# Patient Record
Sex: Male | Born: 1960 | Race: White | Hispanic: No | Marital: Married | Smoking: Former smoker
Health system: Southern US, Community
[De-identification: ages and names within clinical notes are randomized; demographics above are authoritative.]

## PROBLEM LIST (undated history)

## (undated) DIAGNOSIS — O2230 Deep phlebothrombosis in pregnancy, unspecified trimester: Secondary | ICD-10-CM

## (undated) DIAGNOSIS — K219 Gastro-esophageal reflux disease without esophagitis: Secondary | ICD-10-CM

## (undated) DIAGNOSIS — F419 Anxiety disorder, unspecified: Secondary | ICD-10-CM

## (undated) DIAGNOSIS — I10 Essential (primary) hypertension: Secondary | ICD-10-CM

## (undated) DIAGNOSIS — R760 Raised antibody titer: Secondary | ICD-10-CM

## (undated) HISTORY — DX: Anxiety disorder, unspecified: F41.9

## (undated) HISTORY — DX: Gastro-esophageal reflux disease without esophagitis: K21.9

## (undated) HISTORY — DX: Deep phlebothrombosis in pregnancy, unspecified trimester: O22.30

## (undated) HISTORY — DX: Raised antibody titer: R76.0

## (undated) HISTORY — PX: OTHER SURGICAL HISTORY: SHX169

## (undated) HISTORY — PX: VASECTOMY: SHX75

## (undated) HISTORY — DX: Essential (primary) hypertension: I10

---

## 2014-11-01 DIAGNOSIS — D6859 Other primary thrombophilia: Secondary | ICD-10-CM | POA: Insufficient documentation

## 2014-11-01 DIAGNOSIS — Z86718 Personal history of other venous thrombosis and embolism: Secondary | ICD-10-CM | POA: Insufficient documentation

## 2020-02-24 ENCOUNTER — Ambulatory Visit: Payer: 59 | Admitting: Cardiology

## 2020-02-24 ENCOUNTER — Encounter: Payer: Self-pay | Admitting: Cardiology

## 2020-02-24 ENCOUNTER — Other Ambulatory Visit: Payer: Self-pay

## 2020-02-24 VITALS — BP 154/92 | HR 92 | Ht 69.0 in | Wt 220.8 lb

## 2020-02-24 DIAGNOSIS — I10 Essential (primary) hypertension: Secondary | ICD-10-CM

## 2020-02-24 DIAGNOSIS — R002 Palpitations: Secondary | ICD-10-CM | POA: Diagnosis not present

## 2020-02-24 MED ORDER — AMLODIPINE BESYLATE 5 MG PO TABS: 5.0000 mg | ORAL_TABLET | Freq: Every day | ORAL | 3 refills | Status: DC

## 2020-02-24 NOTE — Progress Notes (Signed)
Referring-Derek Pollard Reason for referral-palpitations  HPI: 59 year old male for evaluation of palpitations at request of Marylene Land PA-C.  Laboratory September 2021 showed hemoglobin 18.5, platelet count 194, potassium 4.7, sodium 139, BUN 15 and creatinine 0.98.  Total cholesterol 179, HDL 71 and LDL 93.  Patient seen at [facility] February 15, 2019 for palpitations and dizziness.  Troponins were normal.  Chest x-ray negative.  Based on report electrocardiogram showed normal sinus rhythm.  Patient typically does not have dyspnea on exertion, orthopnea, PND, pedal edema, syncope or exertional chest pain.  Approximately 1 week ago he felt as though his blood pressure was high and felt "off".  He then later developed a pounding sensation in his chest lasting approximately 2 hours.  There was mild dizziness but no syncope.  No significant chest pain.  He was seen in the emergency room as outlined above.  Since that time he has had several episodes of his heart pounding for 15 minutes.  His blood pressure has spiked at times and is in the 150-170 range by his report.  Cardiology now asked to evaluate.  Note he does not have heat intolerance.  And has lost no weight recently.  Current Outpatient Medications  Medication Sig Dispense Refill   Cholecalciferol 25 MCG (1000 UT) tablet Take 1,000 Units by mouth daily.     cyanocobalamin 1000 MCG tablet Take 1,000 mcg by mouth daily.     dextromethorphan-guaiFENesin (MUCINEX DM) 30-600 MG 12hr tablet Take 1 tablet by mouth daily.     escitalopram (LEXAPRO) 20 MG tablet Take 20 mg by mouth daily.     ketoconazole (NIZORAL) 2 % shampoo Apply 1 application topically as needed. Skin irritation     lisinopril (ZESTRIL) 10 MG tablet Take 10 mg by mouth daily.     testosterone cypionate (DEPOTESTOSTERONE CYPIONATE) 200 MG/ML injection Inject 200 mg into the muscle every 14 (fourteen) days.     No current  facility-administered medications for this visit.    Allergies  Allergen Reactions   Diclofenac Sodium Nausea And Vomiting   Other    Chlorhexidine Rash     Past Medical History:  Diagnosis Date   Anxiety    DVT (deep vein thrombosis) in pregnancy    Gastroesophageal reflux disease    Hypertension    Lupus anticoagulant positive     Past Surgical History:  Procedure Laterality Date   Ankle fracture repair     VASECTOMY      Social History   Socioeconomic History   Marital status: Married    Spouse name: Not on file   Number of children: 5   Years of education: Not on file   Highest education level: Not on file  Occupational History   Not on file  Tobacco Use   Smoking status: Former Smoker  Substance and Sexual Activity   Alcohol use: Yes    Comment: 4 beers per day   Drug use: Not on file   Sexual activity: Not on file  Other Topics Concern   Not on file  Social History Narrative   Not on file   Social Determinants of Health   Financial Resource Strain:    Difficulty of Paying Living Expenses: Not on file  Food Insecurity:    Worried About Programme researcher, broadcasting/film/video in the Last Year: Not on file   [employer] of Food in the Last Year: Not on file  Transportation Needs:    Lack  of Transportation (Medical): Not on file   Lack of Transportation (Non-Medical): Not on file  Physical Activity:    Days of Exercise per Week: Not on file   Minutes of Exercise per Session: Not on file  Stress:    Feeling of Stress : Not on file  Social Connections:    Frequency of Communication with Friends and Family: Not on file   Frequency of Social Gatherings with Friends and Family: Not on file   Attends Religious Services: Not on file   Active Member of Clubs or Organizations: Not on file   Attends Banker Meetings: Not on file   Marital Status: Not on file  Intimate Partner Violence:    Fear of Current or Ex-Partner: Not on  file   Emotionally Abused: Not on file   Physically Abused: Not on file   Sexually Abused: Not on file    Family History  Problem Relation Age of Onset   Throat cancer Father     ROS: Diarrhea that he attributes to lisinopril but no fevers or chills, productive cough, hemoptysis, dysphasia, odynophagia, melena, hematochezia, dysuria, hematuria, rash, seizure activity, orthopnea, PND, pedal edema, claudication. Remaining systems are negative.  Physical Exam:   Blood pressure (!) 154/92, pulse 92, height 5\' 9"  (1.753 m), weight 220 lb 12.8 oz (100.2 kg).  General:  Well developed/well nourished anxious in NAD Skin warm/dry Patient not depressed No peripheral clubbing Back-normal HEENT-normal/normal eyelids Neck supple/normal carotid upstroke bilaterally; no bruits; no JVD; no thyromegaly chest - CTA/ normal expansion CV - RRR/normal S1 and S2; no murmurs, rubs or gallops;  PMI nondisplaced Abdomen -NT/ND, no HSM, no mass, + bowel sounds, no bruit 2+ femoral pulses, no bruits Ext-no edema, chords, 2+ DP Neuro-grossly nonfocal  ECG -normal sinus rhythm at a rate of 92, no ST changes.  Personally reviewed  A/P  1 palpitations-etiology unclear.  We will arrange an echocardiogram to assess LV function.  Check TSH.  I have discussed an apple watch or similar model to record rhythm strips.  We will treat if arrhythmia identified.  2 hypertension-patient's blood pressure has been elevated.  He feels as though lisinopril has caused diarrhea.  We will discontinue.  Instead I will treat with amlodipine 5 mg daily.  Follow blood pressure and adjust regimen as needed.  Likely has primary hypertension.  We will consider evaluation for secondary causes such as pheochromocytoma or renal artery stenosis if blood pressure difficult to control.  , MD

## 2020-02-24 NOTE — Patient Instructions (Signed)
Medication Instructions:   STOP LISINOPRIL  START AMLODIPINE 5 MG ONCE DAILY  *If you need a refill on your cardiac medications before your next appointment, please call your pharmacy*   Lab Work:  Your physician recommends that you HAVE LAB WORK TODAY  If you have labs (blood work) drawn today and your tests are completely normal, you will receive your results only by: Marland Kitchen MyChart Message (if you have MyChart) OR . A paper copy in the mail If you have any lab test that is abnormal or we need to change your treatment, we will call you to review the results.   Testing/Procedures:  Your physician has requested that you have an echocardiogram. Echocardiography is a painless test that uses sound waves to create images of your heart. It provides your doctor with information about the size and shape of your heart and how well your heart's chambers and valves are working. This procedure takes approximately one hour. There are no restrictions for this procedure.[address] [place], 1ST FLOOR IMAGING DEPARTMENT     Follow-Up: At [facility], you and your health needs are our priority.  As part of our continuing mission to provide you with exceptional heart care, we have created designated Provider Care Teams.  These Care Teams include your primary Cardiologist (physician) and Advanced Practice Providers (APPs -  Physician Assistants and Nurse Practitioners) who all work together to provide you with the care you need, when you need it.  We recommend signing up for the patient portal called "MyChart".  Sign up information is provided on this After Visit Summary.  MyChart is used to connect with patients for Virtual Visits (Telemedicine).  Patients are able to view lab/test results, encounter notes, upcoming appointments, etc.  Non-urgent messages can be sent to your provider as well.   To learn more about what you can do with MyChart, go to [email or web address].    Your next  appointment:   8 week(s)  The format for your next appointment:   In Person  Provider:   Olga Millers, MD

## 2020-02-25 ENCOUNTER — Encounter: Payer: Self-pay | Admitting: *Deleted

## 2020-02-25 LAB — TSH: TSH: 1.7 m[IU]/L (ref 0.40–4.50)

## 2020-02-29 ENCOUNTER — Telehealth: Payer: Self-pay | Admitting: Cardiology

## 2020-02-29 NOTE — Telephone Encounter (Signed)
° ° °  Pt said over the weekend he felt his heart beating hard and fast he look at his watch and he was in Afib. He is calling to get Dr. Jens Som recommendations.

## 2020-02-29 NOTE — Telephone Encounter (Signed)
Patient states that on Saturday afternoon he had a 10-minute long episode of feeling pounding and irregular fluttering in his chest that was also flagged as afib by his new apple watch. This occurred after going to football game. He unpacked his car and then sat down to watch some TV. Pt states he sat quietly and it went away on its own. Pt adds that he feels these pounding, fluttering sensations frequently, but they generally only last for about 30 seconds. This does not happen every day but can happen up to 3 times a day. Pt states he doesn't feel like activity brings it on. For example, pt did strenuous yard work at other times over the weekend and did not experience the chest sensations. Pt states the readings per his watch give him an average heart rate of 96 bpm, but does not tell him the maximum HR achieved. Pt states that his heart rate can vary from the high 50s to the high 90s throughout the day even when he is not feeling noticeable symptoms.  Pt is going to complete his MyChart registration and upload the PDF documents of the rhythm strips from his watch to his chart for review.  Pt has not been able to take his blood pressure at home during these episodes. Of note, he has not started yet started the Norvasc that was ordered for him. He has been continuing the remaining doses of his lisinopril. However, his Norvasc arrived in the mail today and will start it tomorrow.  Will seek Dr. Ludwig Clarks recommendations.

## 2020-02-29 NOTE — Telephone Encounter (Signed)
Need rhythm strips to review before deciding whether this is atrial fibrillation or not. Derek Pollard

## 2020-03-01 NOTE — Telephone Encounter (Signed)
See my chart encounter.

## 2020-03-09 DIAGNOSIS — R002 Palpitations: Secondary | ICD-10-CM

## 2020-03-09 MED ORDER — AMLODIPINE BESYLATE 10 MG PO TABS: 10.0000 mg | ORAL_TABLET | Freq: Every day | ORAL | 3 refills | Status: DC

## 2020-03-09 NOTE — Telephone Encounter (Signed)
Change amlodipine to 10 mg daily and follow blood pressure.  Olga Millers

## 2020-03-16 ENCOUNTER — Other Ambulatory Visit: Payer: Self-pay

## 2020-03-16 ENCOUNTER — Ambulatory Visit (HOSPITAL_BASED_OUTPATIENT_CLINIC_OR_DEPARTMENT_OTHER)
Admission: RE | Admit: 2020-03-16 | Discharge: 2020-03-16 | Disposition: A | Discharge status: Home / Self Care | Payer: 59 | Source: Ambulatory Visit | Attending: Cardiology | Admitting: Cardiology

## 2020-03-16 DIAGNOSIS — R002 Palpitations: Secondary | ICD-10-CM | POA: Diagnosis not present

## 2020-03-16 LAB — ECHOCARDIOGRAM COMPLETE
Area-P 1/2: 4.17 cm2
S' Lateral: 3.2 cm

## 2020-03-21 ENCOUNTER — Encounter: Payer: Self-pay | Admitting: *Deleted

## 2020-05-13 NOTE — Progress Notes (Signed)
HPI: FU palpitations.  Echocardiogram November 2021 showed normal LV function, possible small PFO.  TSH November 2021 1.7.  Patient sent strips associated with palpitations and it showed sinus with PVCs.  We also have increased his amlodipine for hypertension.  Since last seen there is no dyspnea, chest pain or syncope.  Occasional flutters when he is stressed.  Current Outpatient Medications  Medication Sig Dispense Refill  . amLODipine (NORVASC) 10 MG tablet Take 1 tablet (10 mg total) by mouth daily. 90 tablet 3  . Cholecalciferol 25 MCG (1000 UT) tablet Take 1,000 Units by mouth daily.    . cyanocobalamin 1000 MCG tablet Take 1,000 mcg by mouth daily.    Marland Kitchen dextromethorphan-guaiFENesin (MUCINEX DM) 30-600 MG 12hr tablet Take 1 tablet by mouth daily.    Marland Kitchen escitalopram (LEXAPRO) 20 MG tablet Take 20 mg by mouth daily.    Marland Kitchen ketoconazole (NIZORAL) 2 % shampoo Apply 1 application topically as needed. Skin irritation    . testosterone cypionate (DEPOTESTOSTERONE CYPIONATE) 200 MG/ML injection Inject 200 mg into the muscle every 14 (fourteen) days.    . predniSONE (DELTASONE) 10 MG tablet Take as directed on HANDOUT.     No current facility-administered medications for this visit.     Past Medical History:  Diagnosis Date  . Anxiety   . DVT (deep vein thrombosis) in pregnancy   . Gastroesophageal reflux disease   . Hypertension   . Lupus anticoagulant positive     Past Surgical History:  Procedure Laterality Date  . Ankle fracture repair    . VASECTOMY      Social History   Socioeconomic History  . Marital status: Married    Spouse name: Not on file  . Number of children: 5  . Years of education: Not on file  . Highest education level: Not on file  Occupational History  . Not on file  Tobacco Use  . Smoking status: Former Games developer  . Smokeless tobacco: Never Used  Substance and Sexual Activity  . Alcohol use: Yes    Comment: 4 beers per day  . Drug use: Not on file   . Sexual activity: Not on file  Other Topics Concern  . Not on file  Social History Narrative  . Not on file   Social Determinants of Health   Financial Resource Strain: Not on file  Food Insecurity: Not on file  Transportation Needs: Not on file  Physical Activity: Not on file  Stress: Not on file  Social Connections: Not on file  Intimate Partner Violence: Not on file    Family History  Problem Relation Age of Onset  . Throat cancer Father     ROS: no fevers or chills, productive cough, hemoptysis, dysphasia, odynophagia, melena, hematochezia, dysuria, hematuria, rash, seizure activity, orthopnea, PND, pedal edema, claudication. Remaining systems are negative.  Physical Exam: Well-developed well-nourished in no acute distress.  Skin is warm and dry.  HEENT is normal.  Neck is supple.  Chest is clear to auscultation with normal expansion.  Cardiovascular exam is regular rate and rhythm.  Abdominal exam nontender or distended. No masses palpated. Extremities show no edema. neuro grossly intact  ECG- personally reviewed  A/P  1 palpitations-patient has sent rhythm strips from his apple watch that showed associated PVCs.  His TSH is normal.  Echocardiogram shows normal LV function.  Symptoms somewhat improved but continued.  Add carvedilol 6.25 mg twice daily and follow.  2 hypertension-continue amlodipine.  Blood pressure  remains elevated.  Add carvedilol as described above both for palpitations and blood pressure.  Advance as tolerated.  I will arrange renal Dopplers to exclude renal artery stenosis.  Olga Millers, MD

## 2020-05-17 ENCOUNTER — Encounter: Payer: Self-pay | Admitting: *Deleted

## 2020-05-18 ENCOUNTER — Other Ambulatory Visit: Payer: Self-pay

## 2020-05-18 ENCOUNTER — Encounter: Payer: Self-pay | Admitting: Cardiology

## 2020-05-18 ENCOUNTER — Ambulatory Visit: Payer: 59 | Admitting: Cardiology

## 2020-05-18 VITALS — BP 153/95 | HR 79 | Ht 69.0 in | Wt 220.4 lb

## 2020-05-18 DIAGNOSIS — R002 Palpitations: Secondary | ICD-10-CM | POA: Diagnosis not present

## 2020-05-18 DIAGNOSIS — I10 Essential (primary) hypertension: Secondary | ICD-10-CM | POA: Diagnosis not present

## 2020-05-18 MED ORDER — CARVEDILOL 6.25 MG PO TABS: 6.2500 mg | ORAL_TABLET | Freq: Two times a day (BID) | ORAL | 3 refills | Status: DC

## 2020-05-18 NOTE — Patient Instructions (Signed)
Medication Instructions:   START CARVEDILOL 6.25 MG ONE TABLET TWICE DAILY  *If you need a refill on your cardiac medications before your next appointment, please call your pharmacy*   Testing/Procedures:  Your physician has requested that you have a renal artery duplex. During this test, an ultrasound is used to evaluate blood flow to the kidneys. Allow one hour for this exam. Do not eat after midnight the day before and avoid carbonated beverages. Take your medications as you usually do.[place] OFFICE-1ST FLOOR IMAGING     Follow-Up: At [facility], you and your health needs are our priority.  As part of our continuing mission to provide you with exceptional heart care, we have created designated Provider Care Teams.  These Care Teams include your primary Cardiologist (physician) and Advanced Practice Providers (APPs -  Physician Assistants and Nurse Practitioners) who all work together to provide you with the care you need, when you need it.  We recommend signing up for the patient portal called "MyChart".  Sign up information is provided on this After Visit Summary.  MyChart is used to connect with patients for Virtual Visits (Telemedicine).  Patients are able to view lab/test results, encounter notes, upcoming appointments, etc.  Non-urgent messages can be sent to your provider as well.   To learn more about what you can do with MyChart, go to [email or web address].    Your next appointment:   6 month(s)  The format for your next appointment:   In Person  Provider:   Olga Millers, MD

## 2020-05-23 NOTE — Telephone Encounter (Signed)
Spoke with pt, he has not started the carvedilol that was given to him 1/26. He will start the carvedilol and after one week he will track his bp and let us know how it is running on the new medication.

## 2020-05-23 NOTE — Telephone Encounter (Signed)
Add losartan 50 mg daily; follow BP; bmet one week  Olga Millers

## 2020-06-10 ENCOUNTER — Ambulatory Visit (HOSPITAL_BASED_OUTPATIENT_CLINIC_OR_DEPARTMENT_OTHER)
Admission: RE | Admit: 2020-06-10 | Discharge: 2020-06-10 | Disposition: A | Discharge status: Home / Self Care | Payer: 59 | Source: Ambulatory Visit | Attending: Cardiology | Admitting: Cardiology

## 2020-06-10 ENCOUNTER — Other Ambulatory Visit: Payer: Self-pay

## 2020-06-10 DIAGNOSIS — I10 Essential (primary) hypertension: Secondary | ICD-10-CM | POA: Insufficient documentation

## 2020-12-22 ENCOUNTER — Encounter: Payer: Self-pay | Admitting: Family Medicine

## 2020-12-22 ENCOUNTER — Other Ambulatory Visit: Payer: Self-pay

## 2020-12-22 ENCOUNTER — Ambulatory Visit: Payer: 59 | Admitting: Family Medicine

## 2020-12-22 VITALS — BP 129/71 | HR 77 | Temp 97.9°F | Ht 68.0 in | Wt 227.0 lb

## 2020-12-22 DIAGNOSIS — R400 Somnolence: Secondary | ICD-10-CM | POA: Diagnosis not present

## 2020-12-22 DIAGNOSIS — E291 Testicular hypofunction: Secondary | ICD-10-CM | POA: Diagnosis not present

## 2020-12-22 DIAGNOSIS — I10 Essential (primary) hypertension: Secondary | ICD-10-CM | POA: Diagnosis not present

## 2020-12-22 DIAGNOSIS — D6859 Other primary thrombophilia: Secondary | ICD-10-CM | POA: Diagnosis not present

## 2020-12-22 LAB — BASIC METABOLIC PANEL
BUN: 13 (ref 4–21)
CO2: 23 — AB (ref 13–22)
Chloride: 103 (ref 99–108)
Creatinine: 0.9 (ref 0.6–1.3)
Glucose: 95
Potassium: 4.5 (ref 3.4–5.3)
Sodium: 140 (ref 137–147)

## 2020-12-22 LAB — CBC AND DIFFERENTIAL
HCT: 47 (ref 41–53)
Hemoglobin: 16.3 (ref 13.5–17.5)
Neutrophils Absolute: 3
Platelets: 168 (ref 150–399)
WBC: 5.7

## 2020-12-22 LAB — HEPATIC FUNCTION PANEL
ALT: 31 (ref 10–40)
AST: 27 (ref 14–40)
Alkaline Phosphatase: 67 (ref 25–125)
Bilirubin, Total: 0.7

## 2020-12-22 LAB — COMPREHENSIVE METABOLIC PANEL
Albumin: 4 (ref 3.5–5.0)
Calcium: 8.9 (ref 8.7–10.7)
Globulin: 2.1

## 2020-12-22 LAB — CBC: RBC: 5.19 — AB (ref 3.87–5.11)

## 2020-12-22 LAB — PSA: PSA: 1.2

## 2020-12-22 LAB — HEMOGLOBIN A1C: Hemoglobin A1C: 5.3

## 2020-12-22 NOTE — Assessment & Plan Note (Signed)
History of lupus anticoagulant and prior DVT.

## 2020-12-22 NOTE — Assessment & Plan Note (Signed)
Concern for OSA with his history of heavy snoring and daytime fatigue.  This may be contributing to his history of hypertension as well.

## 2020-12-22 NOTE — Progress Notes (Signed)
Derek Pollard - 60 y.o. male MRN [id number]  Date of birth: 1960/06/25  Subjective Chief Complaint  Patient presents with   Establish Care    HPI Derek Pollard is a 60 year old male here today for initial visit to establish care.  He has history of hypertension, DVT, generalized anxiety and GERD.  He has seen cardiology for hypertension.  Currently treated with amlodipine and carvedilol.  He has had echocardiogram and renal artery duplex to evaluate for cause of his hypertension.  He does admit to significant daytime fatigue and snoring.  He has never had a sleep study.  He has also had some PVCs and carvedilol was increased at his last appointment with cardiology with better control of these.  He denies anginal symptoms, headache, shortness of breath.   He does admit to some anxiety which he feels contributes to his blood pressure as well.  He is currently taking Lexapro which he feels is working pretty well for him.  Anxiety has improved quite a bit since starting this.  He is taking testosterone for history of hypogonadism.  He has been on a stable dose at 200 mg every 2 weeks.  ROS:  A comprehensive ROS was completed and negative except as noted per HPI    Allergies  Allergen Reactions   Diclofenac Sodium Nausea And Vomiting   Chlorhexidine Rash    Past Medical History:  Diagnosis Date   Anxiety    DVT (deep vein thrombosis) in pregnancy    Gastroesophageal reflux disease    Hypertension    Lupus anticoagulant positive     Past Surgical History:  Procedure Laterality Date   Ankle fracture repair     VASECTOMY      Social History   Socioeconomic History   Marital status: Married    Spouse name: Not on file   Number of children: 5   Years of education: Not on file   Highest education level: Not on file  Occupational History   Not on file  Tobacco Use   Smoking status: Former    Packs/day: 0.25    Years: 1.00    Pack years: 0.25    Types: Cigarettes    Passive  exposure: Never   Smokeless tobacco: Never  Vaping Use   Vaping Use: Never used  Substance and Sexual Activity   Alcohol use: Not Currently    Alcohol/week: 28.0 standard drinks    Types: 28 Cans of beer per week    Comment: 4 beers per day   Drug use: Never   Sexual activity: Yes  Other Topics Concern   Not on file  Social History Narrative   Not on file   Social Determinants of Health   Financial Resource Strain: Not on file  Food Insecurity: Not on file  Transportation Needs: Not on file  Physical Activity: Not on file  Stress: Not on file  Social Connections: Not on file    Family History  Problem Relation Age of Onset   Hypertension Mother    Throat cancer Father    Stroke Sister    Heart attack Maternal Grandfather    Heart attack Paternal Grandfather     Health Maintenance  Topic Date Due   HIV Screening  Never done   Hepatitis C Screening  Never done   TETANUS/TDAP  Never done   COLONOSCOPY (Pts 45-8yrs Insurance coverage will need to be confirmed)  Never done   Zoster Vaccines- Shingrix (1 of 2) Never done   COVID-19 Vaccine (  3 - Booster for Moderna series) 05/26/2020   INFLUENZA VACCINE  Never done   Pneumococcal Vaccine 35-81 Years old  Aged Out   HPV VACCINES  Aged Out     ----------------------------------------------------------------------------------------------------------------------------------------------------------------------------------------------------------------- Physical Exam BP 129/71 (BP Location: Left Arm, Patient Position: Sitting, Cuff Size: Large)   Pulse 77   Temp 97.9 F (36.6 C)   Ht 5\' 8"  (1.727 m)   Wt 227 lb (103 kg)   SpO2 99%   BMI 34.52 kg/m   Physical Exam Constitutional:      Appearance: Normal appearance.  HENT:     Head: Normocephalic and atraumatic.  Eyes:     General: No scleral icterus. Cardiovascular:     Rate and Rhythm: Normal rate and regular rhythm.  Pulmonary:     Effort: Pulmonary effort  is normal.     Breath sounds: Normal breath sounds.  Musculoskeletal:     Cervical back: Neck supple.  Neurological:     General: No focal deficit present.     Mental Status: He is alert.  Psychiatric:        Mood and Affect: Mood normal.        Behavior: Behavior normal.    ------------------------------------------------------------------------------------------------------------------------------------------------------------------------------------------------------------------- Assessment and Plan  Essential hypertension Blood pressure remains well controlled with current medications for management of hypertension.  Recommend continuation, low-sodium diet.  He will have previous labs sent over.  Other primary thrombophilia (HCC) History of lupus anticoagulant and prior DVT.  Daytime somnolence Concern for OSA with his history of heavy snoring and daytime fatigue.  This may be contributing to his history of hypertension as well.  Hypogonadism in male Currently receiving testosterone replacement therapy in the form of Depo testosterone at 200 mg every 2 weeks.  He feels pretty good at current dose.   No orders of the defined types were placed in this encounter.   Return in about 4 months (around 04/23/2021) for HTN.    This visit occurred during the SARS-CoV-2 public health emergency.  Safety protocols were in place, including screening questions prior to the visit, additional usage of staff PPE, and extensive cleaning of exam room while observing appropriate contact time as indicated for disinfecting solutions.  Amy Amy Amy you are snoring so loud I cannot its like messing up my dictation yes

## 2020-12-22 NOTE — Patient Instructions (Signed)
Great to meet you today! You should be contacted to set up appointment for sleep study.  Continue current medications.  Drop off copy of labs.  See me again in about 4 months.

## 2020-12-22 NOTE — Assessment & Plan Note (Addendum)
Currently receiving testosterone replacement therapy in the form of Depo testosterone at 200 mg every 2 weeks.  He feels pretty good at current dose.

## 2020-12-22 NOTE — Assessment & Plan Note (Signed)
Blood pressure remains well controlled with current medications for management of hypertension.  Recommend continuation, low-sodium diet.  He will have previous labs sent over.

## 2020-12-27 ENCOUNTER — Encounter: Payer: Self-pay | Admitting: Family Medicine

## 2020-12-27 NOTE — Telephone Encounter (Signed)
Labs printed and placed in Dr. Ashley Royalty box for review.  Derek Pollard, CMA

## 2021-01-06 LAB — URIC ACID: Uric Acid: 5.4

## 2021-01-06 LAB — TESTOSTERONE: Testosterone: 690

## 2021-01-11 ENCOUNTER — Telehealth: Payer: Self-pay

## 2021-01-11 NOTE — Telephone Encounter (Signed)
Pt called stating that he has not heard from anyone regarding the scheduling for his sleep study.  Please follow up for pt and call him with update.  Tiajuana Amass, CMA

## 2021-01-16 NOTE — Telephone Encounter (Signed)
Referral did not get set on 9/1 I sent referral to [facility] today they should call and schedule patient - CF

## 2021-02-20 NOTE — Progress Notes (Signed)
HPI: FU palpitations.  Echocardiogram November 2021 showed normal LV function, possible small PFO.  TSH November 2021 1.7.  Patient sent strips associated with palpitations and it showed sinus with PVCs.  Renal Dopplers February 2022 showed no renal artery stenosis.  Since last seen the patient denies any dyspnea on exertion, orthopnea, PND, pedal edema, palpitations, syncope or chest pain.   Current Outpatient Medications  Medication Sig Dispense Refill   allopurinol (ZYLOPRIM) 300 MG tablet Take 300 mg by mouth daily.     amLODipine (NORVASC) 10 MG tablet Take 1 tablet (10 mg total) by mouth daily. 90 tablet 3   carvedilol (COREG) 12.5 MG tablet Take 12.5 mg by mouth 2 (two) times daily.     dextromethorphan-guaiFENesin (MUCINEX DM) 30-600 MG 12hr tablet Take 1 tablet by mouth daily.     escitalopram (LEXAPRO) 20 MG tablet Take 20 mg by mouth daily.     ketoconazole (NIZORAL) 2 % shampoo Apply 1 application topically as needed. Skin irritation     testosterone cypionate (DEPOTESTOSTERONE CYPIONATE) 200 MG/ML injection Inject 200 mg into the muscle every 14 (fourteen) days.     No current facility-administered medications for this visit.     Past Medical History:  Diagnosis Date   Anxiety    DVT (deep vein thrombosis) in pregnancy    Gastroesophageal reflux disease    Hypertension    Lupus anticoagulant positive     Past Surgical History:  Procedure Laterality Date   Ankle fracture repair     VASECTOMY      Social History   Socioeconomic History   Marital status: Married    Spouse name: Not on file   Number of children: 5   Years of education: Not on file   Highest education level: Not on file  Occupational History   Not on file  Tobacco Use   Smoking status: Former    Packs/day: 0.25    Years: 1.00    Pack years: 0.25    Types: Cigarettes    Passive exposure: Never   Smokeless tobacco: Never  Vaping Use   Vaping Use: Never used  Substance and Sexual  Activity   Alcohol use: Not Currently    Alcohol/week: 28.0 standard drinks    Types: 28 Cans of beer per week    Comment: 4 beers per day   Drug use: Never   Sexual activity: Yes  Other Topics Concern   Not on file  Social History Narrative   Not on file   Social Determinants of Health   Financial Resource Strain: Not on file  Food Insecurity: Not on file  Transportation Needs: Not on file  Physical Activity: Not on file  Stress: Not on file  Social Connections: Not on file  Intimate Partner Violence: Not on file    Family History  Problem Relation Age of Onset   Hypertension Mother    Throat cancer Father    Stroke Sister    Heart attack Maternal Grandfather    Heart attack Paternal Grandfather     ROS: no fevers or chills, productive cough, hemoptysis, dysphasia, odynophagia, melena, hematochezia, dysuria, hematuria, rash, seizure activity, orthopnea, PND, pedal edema, claudication. Remaining systems are negative.  Physical Exam: Well-developed well-nourished in no acute distress.  Skin is warm and dry.  HEENT is normal.  Neck is supple.  Chest is clear to auscultation with normal expansion.  Cardiovascular exam is regular rate and rhythm.  Abdominal exam nontender or distended.  No masses palpated. Extremities show no edema. neuro grossly intact  ECG-normal sinus rhythm at a rate of 71, occasional PVC, no ST changes.  Personally reviewed  A/P  1 palpitations-previous strips from his apple watch showed PVCs.  Continue beta-blocker.  Note LV function is normal.  Her symptoms have improved.  2 hypertension-blood pressure elevated; increase carvedilol to 25 mg twice daily and follow.  3 coronary risk factors-we will arrange calcium score for risk stratification.  Olga Millers, MD

## 2021-02-27 ENCOUNTER — Other Ambulatory Visit: Payer: Self-pay

## 2021-02-27 ENCOUNTER — Ambulatory Visit: Payer: 59 | Admitting: Cardiology

## 2021-02-27 ENCOUNTER — Encounter: Payer: Self-pay | Admitting: Cardiology

## 2021-02-27 VITALS — BP 154/91 | HR 71 | Ht 68.0 in | Wt 233.4 lb

## 2021-02-27 DIAGNOSIS — I10 Essential (primary) hypertension: Secondary | ICD-10-CM | POA: Diagnosis not present

## 2021-02-27 DIAGNOSIS — R002 Palpitations: Secondary | ICD-10-CM | POA: Diagnosis not present

## 2021-02-27 DIAGNOSIS — Z136 Encounter for screening for cardiovascular disorders: Secondary | ICD-10-CM | POA: Diagnosis not present

## 2021-02-27 MED ORDER — CARVEDILOL 25 MG PO TABS: 25.0000 mg | ORAL_TABLET | Freq: Two times a day (BID) | ORAL | 3 refills | Status: DC

## 2021-02-27 NOTE — Patient Instructions (Signed)
Medication Instructions:   INCREASE CARVEDILOL TO 25 MG TWICE DAILY= 2 OF THE 12.5 MG TABLETS TWICE DAILY  *If you need a refill on your cardiac medications before your next appointment, please call your pharmacy*   Testing/Procedures:  CORONARY CT CALCIUM SCORING AT THE [place] OFFICE   Follow-Up: At [facility], you and your health needs are our priority.  As part of our continuing mission to provide you with exceptional heart care, we have created designated Provider Care Teams.  These Care Teams include your primary Cardiologist (physician) and Advanced Practice Providers (APPs -  Physician Assistants and Nurse Practitioners) who all work together to provide you with the care you need, when you need it.  We recommend signing up for the patient portal called "MyChart".  Sign up information is provided on this After Visit Summary.  MyChart is used to connect with patients for Virtual Visits (Telemedicine).  Patients are able to view lab/test results, encounter notes, upcoming appointments, etc.  Non-urgent messages can be sent to your provider as well.   To learn more about what you can do with MyChart, go to [email or web address].    Your next appointment:   12 month(s)  The format for your next appointment:   In Person  Provider:   Olga Millers MD

## 2021-03-13 ENCOUNTER — Ambulatory Visit (INDEPENDENT_AMBULATORY_CARE_PROVIDER_SITE_OTHER): Payer: Self-pay

## 2021-03-13 ENCOUNTER — Other Ambulatory Visit: Payer: Self-pay

## 2021-03-13 DIAGNOSIS — Z136 Encounter for screening for cardiovascular disorders: Secondary | ICD-10-CM

## 2021-03-15 ENCOUNTER — Telehealth: Payer: Self-pay | Admitting: *Deleted

## 2021-03-15 DIAGNOSIS — I2510 Atherosclerotic heart disease of native coronary artery without angina pectoris: Secondary | ICD-10-CM

## 2021-03-15 MED ORDER — ROSUVASTATIN CALCIUM 40 MG PO TABS: 40.0000 mg | ORAL_TABLET | Freq: Every day | ORAL | 3 refills | Status: DC

## 2021-03-15 NOTE — Telephone Encounter (Signed)
pt aware of results  New script sent to the pharmacy  Lab orders mailed to the pt  

## 2021-03-15 NOTE — Telephone Encounter (Signed)
-----   Message from Lewayne Bunting, MD sent at 03/13/2021  6:20 PM EST ----- Elevated Ca score; add crestor 40 mg daily; lipids and liver 8 weeks Olga Millers

## 2021-03-27 ENCOUNTER — Other Ambulatory Visit: Payer: Self-pay | Admitting: Family Medicine

## 2021-03-27 ENCOUNTER — Encounter: Payer: Self-pay | Admitting: Family Medicine

## 2021-03-28 ENCOUNTER — Encounter: Payer: Self-pay | Admitting: Family Medicine

## 2021-03-28 ENCOUNTER — Ambulatory Visit: Payer: 59 | Admitting: Family Medicine

## 2021-03-28 ENCOUNTER — Other Ambulatory Visit: Payer: Self-pay

## 2021-03-28 VITALS — BP 123/77 | HR 66 | Temp 97.9°F | Ht 68.0 in | Wt 231.0 lb

## 2021-03-28 DIAGNOSIS — I10 Essential (primary) hypertension: Secondary | ICD-10-CM | POA: Diagnosis not present

## 2021-03-28 DIAGNOSIS — K1370 Unspecified lesions of oral mucosa: Secondary | ICD-10-CM

## 2021-03-28 DIAGNOSIS — J0140 Acute pansinusitis, unspecified: Secondary | ICD-10-CM | POA: Diagnosis not present

## 2021-03-28 DIAGNOSIS — E291 Testicular hypofunction: Secondary | ICD-10-CM | POA: Diagnosis not present

## 2021-03-28 DIAGNOSIS — M109 Gout, unspecified: Secondary | ICD-10-CM

## 2021-03-28 MED ORDER — CARVEDILOL 25 MG PO TABS: 25.0000 mg | ORAL_TABLET | Freq: Two times a day (BID) | ORAL | 3 refills | Status: DC

## 2021-03-28 MED ORDER — MAGIC MOUTHWASH W/LIDOCAINE: 15.0000 mL | Freq: Four times a day (QID) | ORAL | 11 refills | Status: DC | PRN

## 2021-03-28 MED ORDER — TESTOSTERONE CYPIONATE 200 MG/ML IM SOLN
200.0000 mg | INTRAMUSCULAR | 0 refills | Status: DC
Start: 2021-03-28 — End: 2021-03-30

## 2021-03-28 MED ORDER — AMOXICILLIN-POT CLAVULANATE 875-125 MG PO TABS: 1.0000 | ORAL_TABLET | Freq: Two times a day (BID) | ORAL | 0 refills | Status: AC

## 2021-03-28 MED ORDER — ALLOPURINOL 300 MG PO TABS: 300.0000 mg | ORAL_TABLET | Freq: Every day | ORAL | 2 refills | Status: DC

## 2021-03-28 NOTE — Patient Instructions (Signed)
-  Antibiotics for the sinus infection. This will also cover an ear infection if one is developing.  -Continue supportive measures including rest, hydration, humidifier use, steam showers, warm compresses to sinuses, warm liquids with lemon and honey, and over-the-counter cough, cold, and analgesics as needed.  -Adding some Magic Mouthwash for the oral lesion. Can also continue warm salt water rinses.  Please contact office for follow-up if symptoms do not improve or worsen. Seek emergency care if symptoms become severe.

## 2021-03-28 NOTE — Progress Notes (Signed)
Acute Office Visit  Subjective:    Patient ID: Derek Pollard, male    DOB: Sep 15, 1960, 60 y.o.   MRN: [id number]  Chief Complaint  Patient presents with   URI    URI   Patient is in today for upper respiratory symptoms.   Patient states about 2 weeks ago he started with some allergy symptoms after chopping leaves. Symptoms would improve for a few days and then return. He has tried Sudafed, Mucinex, antihistamines. Current symptoms include chest congestion, cough, nasal congestion, rhinorrhea, sinus pressure and left ear pressure. Reports his symptoms tend to be worse first thing in the morning and later at night. He denies any fevers, nausea, vomiting, diarrhea, chest pain, dyspnea, wheezing.   Additionally he has noticed a few days of a sore in the back of his left cheek. Doesn't think he bit his cheek. States it has gotten a little better with putting some peroxide on it. No unusual odors, taste, drainage, erythema.   Past Medical History:  Diagnosis Date   Anxiety    DVT (deep vein thrombosis) in pregnancy    Gastroesophageal reflux disease    Hypertension    Lupus anticoagulant positive     Past Surgical History:  Procedure Laterality Date   Ankle fracture repair     VASECTOMY      Family History  Problem Relation Age of Onset   Hypertension Mother    Throat cancer Father    Stroke Sister    Heart attack Maternal Grandfather    Heart attack Paternal Grandfather     Social History   Socioeconomic History   Marital status: Married    Spouse name: Not on file   Number of children: 5   Years of education: Not on file   Highest education level: Not on file  Occupational History   Not on file  Tobacco Use   Smoking status: Former    Packs/day: 0.25    Years: 1.00    Pack years: 0.25    Types: Cigarettes    Passive exposure: Never   Smokeless tobacco: Never  Vaping Use   Vaping Use: Never used  Substance and Sexual Activity   Alcohol use: Not Currently     Alcohol/week: 28.0 standard drinks    Types: 28 Cans of beer per week    Comment: 4 beers per day   Drug use: Never   Sexual activity: Yes  Other Topics Concern   Not on file  Social History Narrative   Not on file   Social Determinants of Health   Financial Resource Strain: Not on file  Food Insecurity: Not on file  Transportation Needs: Not on file  Physical Activity: Not on file  Stress: Not on file  Social Connections: Not on file  Intimate Partner Violence: Not on file    Outpatient Medications Prior to Visit  Medication Sig Dispense Refill   dextromethorphan-guaiFENesin (MUCINEX DM) 30-600 MG 12hr tablet Take 1 tablet by mouth daily.     escitalopram (LEXAPRO) 20 MG tablet Take 20 mg by mouth daily.     ketoconazole (NIZORAL) 2 % shampoo Apply 1 application topically as needed. Skin irritation     rosuvastatin (CRESTOR) 40 MG tablet Take 1 tablet (40 mg total) by mouth daily. 90 tablet 3   allopurinol (ZYLOPRIM) 300 MG tablet Take 300 mg by mouth daily.     carvedilol (COREG) 25 MG tablet Take 1 tablet (25 mg total) by mouth 2 (two) times daily. 180  tablet 3   testosterone cypionate (DEPOTESTOSTERONE CYPIONATE) 200 MG/ML injection Inject 200 mg into the muscle every 14 (fourteen) days.     amLODipine (NORVASC) 10 MG tablet Take 1 tablet (10 mg total) by mouth daily. 90 tablet 3   No facility-administered medications prior to visit.    Allergies  Allergen Reactions   Diclofenac Sodium Nausea And Vomiting   Chlorhexidine Rash    Review of Systems All review of systems negative except what is listed in the HPI     Objective:    Physical Exam Vitals reviewed.  Constitutional:      Appearance: Normal appearance.  HENT:     Head: Normocephalic and atraumatic.     Right Ear: Tympanic membrane normal.     Left Ear: Tympanic membrane normal.     Nose: Congestion present.     Mouth/Throat:     Mouth: Mucous membranes are moist.     Pharynx: Oropharynx is  clear.     Comments: Small ulceration inside left cheek, no surrounding erythema, edema, drainage Eyes:     Extraocular Movements: Extraocular movements intact.     Conjunctiva/sclera: Conjunctivae normal.  Cardiovascular:     Rate and Rhythm: Normal rate and regular rhythm.     Pulses: Normal pulses.     Heart sounds: Normal heart sounds.  Pulmonary:     Effort: Pulmonary effort is normal.     Breath sounds: Normal breath sounds.  Musculoskeletal:     Cervical back: Normal range of motion and neck supple. No tenderness.  Lymphadenopathy:     Cervical: No cervical adenopathy.  Skin:    General: Skin is warm and dry.     Findings: No erythema or rash.  Neurological:     General: No focal deficit present.     Mental Status: He is alert and oriented to person, place, and time. Mental status is at baseline.  Psychiatric:        Mood and Affect: Mood normal.        Behavior: Behavior normal.        Thought Content: Thought content normal.        Judgment: Judgment normal.    BP 123/77 (BP Location: Left Arm, Patient Position: Sitting, Cuff Size: Large)   Pulse 66   Temp 97.9 F (36.6 C) (Oral)   Ht 5\' 8"  (1.727 m)   Wt 231 lb 0.6 oz (104.8 kg)   SpO2 99%   BMI 35.13 kg/m  Wt Readings from Last 3 Encounters:  03/28/21 231 lb 0.6 oz (104.8 kg)  02/27/21 233 lb 6.4 oz (105.9 kg)  12/22/20 227 lb (103 kg)    Health Maintenance Due  Topic Date Due   HIV Screening  Never done   Hepatitis C Screening  Never done   COLONOSCOPY (Pts 45-18yrs Insurance coverage will need to be confirmed)  Never done    There are no preventive care reminders to display for this patient.   Lab Results  Component Value Date   TSH 1.70 02/24/2020   Lab Results  Component Value Date   WBC 5.7 12/22/2020   HGB 16.3 12/22/2020   HCT 47 12/22/2020   PLT 168 12/22/2020   Lab Results  Component Value Date   NA 140 12/22/2020   K 4.5 12/22/2020   CO2 23 (A) 12/22/2020   BUN 13 12/22/2020    CREATININE 0.9 12/22/2020   ALKPHOS 67 12/22/2020   AST 27 12/22/2020   ALT 31 12/22/2020  ALBUMIN 4.0 12/22/2020   CALCIUM 8.9 12/22/2020   No results found for: CHOL No results found for: HDL No results found for: LDLCALC No results found for: TRIG No results found for: [facility] Lab Results  Component Value Date   HGBA1C 5.3 12/22/2020       Assessment & Plan:   1. Acute non-recurrent pansinusitis -Antibiotics for the sinus infection. This will also cover an ear infection if one is developing.  -Continue supportive measures including rest, hydration, humidifier use, steam showers, warm compresses to sinuses, warm liquids with lemon and honey, and over-the-counter cough, cold, and analgesics as needed.   - amoxicillin-clavulanate (AUGMENTIN) 875-125 MG tablet; Take 1 tablet by mouth 2 (two) times daily for 10 days.  Dispense: 20 tablet; Refill: 0  2. Oral lesion Magic mouthwash and warm salt water rinse/gargle.  - magic mouthwash w/lidocaine SOLN; Take 15 mLs by mouth 4 (four) times daily as needed for mouth pain. Swish, gargle and spit.  Dispense: 500 mL; Refill: 11  3. Hypogonadism in male - testosterone cypionate (DEPOTESTOSTERONE CYPIONATE) 200 MG/ML injection; Inject 1 mL (200 mg total) into the muscle every 14 (fourteen) days.  Dispense: 10 mL; Refill: 0  4. Essential hypertension - carvedilol (COREG) 25 MG tablet; Take 1 tablet (25 mg total) by mouth 2 (two) times daily.  Dispense: 180 tablet; Refill: 3  5. Gout, unspecified cause, unspecified chronicity, unspecified site - allopurinol (ZYLOPRIM) 300 MG tablet; Take 1 tablet (300 mg total) by mouth daily.  Dispense: 30 tablet; Refill: 2   Please contact office for follow-up if symptoms do not improve or worsen. Seek emergency care if symptoms become severe.   Clayborne Dana, NP

## 2021-03-30 ENCOUNTER — Other Ambulatory Visit: Payer: Self-pay | Admitting: Family Medicine

## 2021-03-30 DIAGNOSIS — E291 Testicular hypofunction: Secondary | ICD-10-CM

## 2021-03-30 DIAGNOSIS — M109 Gout, unspecified: Secondary | ICD-10-CM

## 2021-03-30 MED ORDER — TESTOSTERONE CYPIONATE 200 MG/ML IM SOLN
200.0000 mg | INTRAMUSCULAR | 2 refills | Status: DC
Start: 2021-03-30 — End: 2022-03-07

## 2021-03-30 MED ORDER — ALLOPURINOL 300 MG PO TABS: 300.0000 mg | ORAL_TABLET | Freq: Every day | ORAL | 2 refills | Status: DC

## 2021-03-30 NOTE — Telephone Encounter (Signed)
Completed.

## 2021-04-12 ENCOUNTER — Other Ambulatory Visit: Payer: Self-pay | Admitting: Cardiology

## 2021-04-12 DIAGNOSIS — R002 Palpitations: Secondary | ICD-10-CM

## 2021-05-04 ENCOUNTER — Other Ambulatory Visit: Payer: Self-pay | Admitting: *Deleted

## 2021-05-04 DIAGNOSIS — I10 Essential (primary) hypertension: Secondary | ICD-10-CM

## 2021-05-04 MED ORDER — CARVEDILOL 25 MG PO TABS: 25.0000 mg | ORAL_TABLET | Freq: Two times a day (BID) | ORAL | 3 refills | Status: DC

## 2021-05-26 ENCOUNTER — Encounter: Payer: Self-pay | Admitting: *Deleted

## 2021-06-17 ENCOUNTER — Emergency Department (INDEPENDENT_AMBULATORY_CARE_PROVIDER_SITE_OTHER): Payer: 59

## 2021-06-17 ENCOUNTER — Other Ambulatory Visit: Payer: Self-pay

## 2021-06-17 ENCOUNTER — Emergency Department
Admission: EM | Admit: 2021-06-17 | Discharge: 2021-06-17 | Disposition: A | Discharge status: Home / Self Care | Payer: 59 | Source: Home / Self Care

## 2021-06-17 ENCOUNTER — Emergency Department: Admit: 2021-06-17 | Payer: Self-pay

## 2021-06-17 DIAGNOSIS — M79671 Pain in right foot: Secondary | ICD-10-CM

## 2021-06-17 MED ORDER — TRAMADOL HCL 50 MG PO TABS: 50.0000 mg | ORAL_TABLET | Freq: Two times a day (BID) | ORAL | 0 refills | Status: DC | PRN

## 2021-06-17 MED ORDER — CELECOXIB 100 MG PO CAPS: 100.0000 mg | ORAL_CAPSULE | Freq: Two times a day (BID) | ORAL | 0 refills | Status: AC

## 2021-06-17 NOTE — ED Triage Notes (Signed)
Pt states that he injured his right foot. Pt states that he has some right foot pain. X2 days

## 2021-06-17 NOTE — ED Provider Notes (Signed)
[facility]    CSN: [id number] Arrival date & time: 06/17/21  1309      History   Chief Complaint Chief Complaint  Patient presents with   Foot Pain    Right foot pain. X2 days    HPI Derek Pollard is a 61 y.o. male.   HPI 61 year old male presents with right foot pain for 2 days secondary to injury.  PMH significant for HTN and history of DVT.  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 Authorizing Provider  allopurinol (ZYLOPRIM) 300 MG tablet Take 1 tablet (300 mg total) by mouth daily. 03/30/21  Yes Everrett Coombe, DO  amLODipine (NORVASC) 10 MG tablet TAKE 1 TABLET DAILY 04/12/21  Yes Lewayne Bunting, MD  carvedilol (COREG) 25 MG tablet Take 1 tablet (25 mg total) by mouth 2 (two) times daily. 05/04/21  Yes Lewayne Bunting, MD  celecoxib (CELEBREX) 100 MG capsule Take 1 capsule (100 mg total) by mouth 2 (two) times daily for 15 days. 06/17/21 07/02/21 Yes Trevor Iha, FNP  dextromethorphan-guaiFENesin (MUCINEX DM) 30-600 MG 12hr tablet Take 1 tablet by mouth daily.   Yes [provider]  escitalopram (LEXAPRO) 20 MG tablet Take 20 mg by mouth daily.   Yes [provider]  ketoconazole (NIZORAL) 2 % shampoo Apply 1 application topically as needed. Skin irritation 01/17/16  Yes [provider]  magic mouthwash w/lidocaine SOLN Take 15 mLs by mouth 4  (four) times daily as needed for mouth pain. Swish, gargle and spit. 03/28/21  Yes Clayborne Dana, NP  testosterone cypionate (DEPOTESTOSTERONE CYPIONATE) 200 MG/ML injection Inject 1 mL (200 mg total) into the muscle every 14 (fourteen) days. 03/30/21  Yes Everrett Coombe, DO  traMADol (ULTRAM) 50 MG tablet Take 1 tablet (50 mg total) by mouth 2 (two) times daily as needed. 06/17/21  Yes Trevor Iha, FNP  rosuvastatin (CRESTOR) 40 MG tablet Take 1 tablet (40 mg total) by mouth daily. 03/15/21 06/13/21  Lewayne Bunting, MD    Family History Family History  Problem Relation Age of Onset   Hypertension Mother    Throat cancer Father    Stroke Sister    Heart attack Maternal Grandfather    Heart attack Paternal Grandfather     Social History Social History   Tobacco Use   Smoking status: Former    Packs/day: 0.25    Years: 1.00    Pack years: 0.25    Types: Cigarettes    Passive exposure: Never   Smokeless tobacco: Never  Vaping Use   Vaping Use: Never used  Substance Use Topics   Alcohol use: Not Currently    Alcohol/week: 28.0 standard drinks    Types: 28 Cans of beer per week    Comment: 4 beers per day   Drug use: Never  Allergies   Diclofenac sodium and Chlorhexidine   Review of Systems Review of Systems  Musculoskeletal:        Right foot pain x2 days    Physical Exam Triage Vital Signs ED Triage Vitals  Enc Vitals Group     BP 06/17/21 1331 129/81     Pulse Rate 06/17/21 1331 71     Resp 06/17/21 1331 18     Temp 06/17/21 1331 97.8 F (36.6 C)     Temp Source 06/17/21 1331 Oral     SpO2 06/17/21 1331 98 %     Weight 06/17/21 1329 225 lb (102.1 kg)     Height 06/17/21 1329 5\' 9"  (1.753 m)     Head Circumference --      Peak Flow --      Pain Score 06/17/21 1329 4     Pain Loc --      Pain Edu? --      Excl. in GC? --    No data found.  Updated Vital Signs BP 129/81 (BP Location: Left Arm)    Pulse 71    Temp 97.8 F (36.6 C) (Oral)    Resp  18    Ht 5\' 9"  (1.753 m)    Wt 225 lb (102.1 kg)    SpO2 98%    BMI 33.23 kg/m      Physical Exam Vitals and nursing note reviewed.  Constitutional:      General: He is not in acute distress.    Appearance: Normal appearance. He is normal weight. He is not ill-appearing.  HENT:     Head: Normocephalic and atraumatic.     Mouth/Throat:     Mouth: Mucous membranes are moist.     Pharynx: Oropharynx is clear.  Eyes:     Extraocular Movements: Extraocular movements intact.     Conjunctiva/sclera: Conjunctivae normal.     Pupils: Pupils are equal, round, and reactive to light.  Cardiovascular:     Rate and Rhythm: Normal rate and regular rhythm.     Pulses: Normal pulses.     Heart sounds: Normal heart sounds.  Pulmonary:     Effort: Pulmonary effort is normal.     Breath sounds: Normal breath sounds.  Musculoskeletal:     Comments: Right foot (plantar aspect): TTP over calcaneus  Skin:    General: Skin is warm.  Neurological:     General: No focal deficit present.     Mental Status: He is alert and oriented to person, place, and time.     UC Treatments / Results  Labs (all labs ordered are listed, but only abnormal results are displayed) Labs Reviewed - No data to display  EKG   Radiology DG Foot Complete Right  Result Date: 06/17/2021 CLINICAL DATA:  RIGHT foot injury. Felt a pop in the RIGHT foot 2 days ago. Pain near the heel. EXAM: RIGHT FOOT COMPLETE - 3+ VIEW COMPARISON:  None. FINDINGS: There is no acute fracture or subluxation. No radiopaque foreign body or soft tissue gas. Calcaneal spur is present. IMPRESSION: No evidence for acute  abnormality.    Plantar calcaneal spur. Electronically Signed   By: M.D.   On: 06/17/2021 14:03    Procedures Procedures (including critical care time)  Medications Ordered in UC Medications - No data to display  Initial Impression / Assessment and Plan / UC Course  I have reviewed the triage vital signs and  the nursing notes.  Pertinent labs &  imaging results that were available during my care of the patient were reviewed by me and considered in my medical decision making (see chart for details).     MDM: 1.  Right foot pain-x-ray revealed no evidence of acute fracture or abnormality, plantar calcaneal spur noted.  Rx'd Celebrex and Tramadol. Advised/informed patient of right foot x-ray results.  Advised patient to take medication (Celebrex) as directed with food to completion.  Advised patient may use tramadol for breakthrough pain daily or as needed.  Encouraged patient to increase daily water intake while taking this medication.  Advised patient if right foot pain worsens and/or unresolved to please follow-up with [facility] orthopedic provider (contact information has been provided with this AVS. patient discharged home, hemodynamically stable Final Clinical Impressions(s) / UC Diagnoses   Final diagnoses:  Foot pain, right     Discharge Instructions      Advised/informed patient of right foot x-ray results.  Advised patient to take medication (Celebrex) as directed with food to completion.  Advised patient may use tramadol for breakthrough pain daily or as needed.  Encouraged patient to increase daily water intake while taking this medication.  Advised patient if right foot pain worsens and/or unresolved to please follow-up with [facility] orthopedic provider (contact information has been provided with this AVS.     ED Prescriptions     Medication Sig Dispense Auth. Provider   celecoxib (CELEBREX) 100 MG capsule Take 1 capsule (100 mg total) by mouth 2 (two) times daily for 15 days. 30 capsule Trevor Iha, FNP   traMADol (ULTRAM) 50 MG tablet Take 1 tablet (50 mg total) by mouth 2 (two) times daily as needed. 20 tablet Trevor Iha, FNP      I have reviewed the PDMP during this encounter.   Trevor Iha, FNP 06/17/21 1430

## 2021-06-17 NOTE — Discharge Instructions (Addendum)
Advised/informed patient of right foot x-ray results.  Advised patient to take medication (Celebrex) as directed with food to completion.  Advised patient may use tramadol for breakthrough pain daily or as needed.  Encouraged patient to increase daily water intake while taking this medication.  Advised patient if right foot pain worsens and/or unresolved to please follow-up with [facility] orthopedic provider (contact information has been provided with this AVS.

## 2021-08-08 ENCOUNTER — Encounter: Payer: Self-pay | Admitting: Family Medicine

## 2021-08-08 ENCOUNTER — Other Ambulatory Visit: Payer: Self-pay | Admitting: Family Medicine

## 2021-08-09 ENCOUNTER — Other Ambulatory Visit: Payer: Self-pay | Admitting: Family Medicine

## 2021-08-09 MED ORDER — ESCITALOPRAM OXALATE 20 MG PO TABS: 20.0000 mg | ORAL_TABLET | Freq: Every day | ORAL | 1 refills | Status: DC

## 2021-08-09 NOTE — Telephone Encounter (Signed)
Updated rx sent in.

## 2021-08-14 ENCOUNTER — Encounter: Payer: Self-pay | Admitting: Family Medicine

## 2021-08-14 ENCOUNTER — Ambulatory Visit: Payer: 59 | Admitting: Family Medicine

## 2021-08-14 VITALS — BP 134/61 | HR 63 | Ht 68.0 in | Wt 239.0 lb

## 2021-08-14 DIAGNOSIS — L299 Pruritus, unspecified: Secondary | ICD-10-CM

## 2021-08-14 DIAGNOSIS — M7731 Calcaneal spur, right foot: Secondary | ICD-10-CM | POA: Diagnosis not present

## 2021-08-14 DIAGNOSIS — R2240 Localized swelling, mass and lump, unspecified lower limb: Secondary | ICD-10-CM

## 2021-08-14 NOTE — Progress Notes (Signed)
? ?Acute Office Visit ? ?Subjective:  ? ?  ?Patient ID: Derek Pollard, male    DOB: [birth year], 61 y.o.   MRN: [id number] ? ?Chief Complaint  ?Patient presents with  ? Edema  ? ? ?HPI ?Patient is in today for Bilat LE edema x 1 month.  He says its fairly symmetric.  He says when he gets really swollen the skin gets really itchy.  Mostly sits at his job but not some walking.  More swelling as the day goes on.  Denies any recent changes.  No medication changes or adjustments.  No increase in salt intake.  No urinary symptoms.  No blood in the urine or stool.  No dizziness lightheadedness.  No chest pain or shortness of breath.  Denies palpitations.  He says the only thing different is he has been dealing with a heel spur in his right foot.  He has been checking his blood pressures at home and they typically run in the 130s.  The swelling usually stops below the knee.  He has worn compression stockings a couple of times and said it was helpful. ? ?ROS ? ? ?   ?Objective:  ?  ?BP 134/61   Pulse 63   Ht 5\' 8"  (1.727 m)   Wt 239 lb (108.4 kg)   SpO2 99%   BMI 36.34 kg/m?  ? ? ?Physical Exam ?Constitutional:   ?   Appearance: He is well-developed.  ?HENT:  ?   Head: Normocephalic and atraumatic.  ?Cardiovascular:  ?   Rate and Rhythm: Normal rate and regular rhythm.  ?   Heart sounds: Normal heart sounds.  ?Pulmonary:  ?   Effort: Pulmonary effort is normal.  ?   Breath sounds: Normal breath sounds.  ?Musculoskeletal:  ?   Comments: 1+ pitting edema in both lower legs  up to knees  ?Skin: ?   General: Skin is warm and dry.  ?Neurological:  ?   Mental Status: He is alert and oriented to person, place, and time.  ?Psychiatric:     ?   Behavior: Behavior normal.  ? ? ?No results found for any visits on 08/14/21. ? ? ?   ?Assessment & Plan:  ? ?Problem List Items Addressed This Visit   ?None ?Visit Diagnoses   ? ? Localized swelling of lower leg    -  Primary  ? Relevant Orders  ? COMPLETE METABOLIC PANEL WITH GFR  ? TSH   ? CBC  ? B Nat Peptide  ? Urine Microalbumin w/creat. ratio  ? Heel spur, right      ? Itching      ? Relevant Orders  ? COMPLETE METABOLIC PANEL WITH GFR  ? TSH  ? CBC  ? B Nat Peptide  ? Urine Microalbumin w/creat. ratio  ? ?  ? ?Lower extremity swelling-discussed that most commonly without any other symptoms such as chest pain, palpitations etc. it is most consistent with venous insufficiency and he does get some improvement with compression stockings.  He actually had an echocardiogram in 2021 with no sign of heart failure.  We will get some updated labs just to rule out abnormal thyroid, renal, liver function.  We will check a BNP though again I am less suspicious of heart failure.  We will also check for proteinuria.  We will call with results once available.  Need to work on healthy diet, low-salt diet. ? ?He is also interested in potentially starting Ozempic.  His wife takes the medication.  Encouraged him to follow-up with his PCP to discuss a treatment plan that would include diet exercise and possibly medication if they feel he is a good candidate.  It sounds like his insurance will not cover Wegovy.  We discussed that there are pros and cons regarding Ozempic as sometimes the insurance company will assume that he is diabetic. ? ?Right heel spur-would like to get in with Dr. Benjamin Stain for further evaluation and treatment he is already been doing ice and stretches.  Walking on concrete at work tends to aggravate his symptoms. ? ? ?No orders of the defined types were placed in this encounter. ? ? ?No follow-ups on file. ? ?[name], MD ? ? ?

## 2021-08-15 LAB — COMPLETE METABOLIC PANEL WITH GFR
AG Ratio: 1.7 (calc) (ref 1.0–2.5)
ALT: 41 U/L (ref 9–46)
AST: 31 U/L (ref 10–35)
Albumin: 4 g/dL (ref 3.6–5.1)
Alkaline phosphatase (APISO): 71 U/L (ref 35–144)
BUN: 13 mg/dL (ref 7–25)
CO2: 28 mmol/L (ref 20–32)
Calcium: 9.2 mg/dL (ref 8.6–10.3)
Chloride: 104 mmol/L (ref 98–110)
Creat: 0.95 mg/dL (ref 0.70–1.30)
Globulin: 2.3 g/dL (calc) (ref 1.9–3.7)
Glucose, Bld: 103 mg/dL — ABNORMAL HIGH (ref 65–99)
Potassium: 5 mmol/L (ref 3.5–5.3)
Sodium: 139 mmol/L (ref 135–146)
Total Bilirubin: 0.7 mg/dL (ref 0.2–1.2)
Total Protein: 6.3 g/dL (ref 6.1–8.1)
eGFR: 93 mL/min/{1.73_m2} (ref >=60)

## 2021-08-15 LAB — CBC
HCT: 45.7 % (ref 38.5–50.0)
Hemoglobin: 15.4 g/dL (ref 13.2–17.1)
MCH: 30.7 pg (ref 27.0–33.0)
MCHC: 33.7 g/dL (ref 32.0–36.0)
MCV: 91 fL (ref 80.0–100.0)
MPV: 9.9 fL (ref 7.5–12.5)
Platelets: 173 10*3/uL (ref 140–400)
RBC: 5.02 10*6/uL (ref 4.20–5.80)
RDW: 12.2 % (ref 11.0–15.0)
WBC: 4.7 10*3/uL (ref 3.8–10.8)

## 2021-08-15 LAB — BRAIN NATRIURETIC PEPTIDE: Brain Natriuretic Peptide: 7 pg/mL (ref <100)

## 2021-08-15 LAB — MICROALBUMIN / CREATININE URINE RATIO
Creatinine, Urine: 125 mg/dL (ref 20–320)
Microalb Creat Ratio: 2 mcg/mg creat (ref <30)
Microalb, Ur: 0.2 mg/dL

## 2021-08-15 LAB — TSH: TSH: 1.6 mIU/L (ref 0.40–4.50)

## 2021-08-15 NOTE — Progress Notes (Signed)
Hi Jabre, your metabolic panel overall looks good.  Kidney function and liver function are normal.  Thyroid looks great.  Blood count is normal, no sign of anemia or infection.  No excess protein loss in the urine.  1 labs still pending but everything looks reassuring so far.  I would recommend that you start wearing her compression stockings each morning.  Focus on a low-salt diet to avoid excess fluid retention.  And again we will let you know as soon as the last lab is back.

## 2021-08-15 NOTE — Progress Notes (Signed)
Cary, final test shows no sign of heart failure as a cause of the swelling which is great.  Lets just focus on making sure that your blood pressure is really well controlled.  We want the top number under 130 if possible.  Make sure you are working on a low-salt diet, wearing your compression stockings and getting regular exercise.  Do encourage some mild weight loss.  That will help as well.  One of your blood pressure pills, amlodipine can also cause a little swelling around the ankles as well so this could be aggravating your symptoms.  So definitely discuss that with Dr. Ashley Royalty when you come back.  Maybe you guys could discuss a temporary alternative or reducing the dose, to see if that may be contributing to the swelling as well.

## 2021-08-28 ENCOUNTER — Ambulatory Visit: Payer: 59 | Admitting: Sports Medicine

## 2021-08-28 DIAGNOSIS — M722 Plantar fascial fibromatosis: Secondary | ICD-10-CM | POA: Diagnosis not present

## 2021-08-28 NOTE — Assessment & Plan Note (Signed)
Pleasant 61 year old male, he has had a long history of pain plantar right foot at the heel, was playing pickle ball and felt a sharp increase in pain. ?He was seen in urgent care, x-rays showed a plantar calcaneal spur and he was referred to me for further evaluation and definitive treatment. ?On exam he has tenderness at the plantar fascial origin, I have difficulty palpating the plantar fascia suggesting partial rupture. ?We discussed the pathophysiology, adding home conditioning, referral for custom molded orthotics and an air heel brace. ?Return to see me in 6 weeks, injection if not better. ?

## 2021-08-28 NOTE — Progress Notes (Signed)
? ? ?  Procedures performed today:   ? ?X-rays personally reviewed, small plantar calcaneal spur noted. ? ?Independent interpretation of notes and tests performed by another provider:  ? ?None. ? ?Brief History, Exam, Impression, and Recommendations:   ? ?Plantar fasciitis, right ?Pleasant 62 year old male, he has had a long history of pain plantar right foot at the heel, was playing pickle ball and felt a sharp increase in pain. ?He was seen in urgent care, x-rays showed a plantar calcaneal spur and he was referred to me for further evaluation and definitive treatment. ?On exam he has tenderness at the plantar fascial origin, I have difficulty palpating the plantar fascia suggesting partial rupture. ?We discussed the pathophysiology, adding home conditioning, referral for custom molded orthotics and an air heel brace. ?Return to see me in 6 weeks, injection if not better. ? ? ? ?___________________________________________ ?Gwen Her. Dianah Field, M.D., ABFM., CAQSM. ?Primary Care and Sports Medicine ?[place] ? ?Adjunct Instructor of Family Medicine  ?[employer] ?

## 2021-08-30 ENCOUNTER — Encounter: Payer: Self-pay | Admitting: Family Medicine

## 2021-08-30 ENCOUNTER — Ambulatory Visit: Payer: 59 | Admitting: Family Medicine

## 2021-08-30 DIAGNOSIS — M722 Plantar fascial fibromatosis: Secondary | ICD-10-CM

## 2021-08-30 NOTE — Progress Notes (Signed)
?  Derek Pollard - 61 y.o. male MRN [id number]  Date of birth: [birth year] ? ?SUBJECTIVE:  Including CC & ROS.  ?No chief complaint on file. ? ? ?Derek Pollard is a 61 y.o. male that is presenting with acute right heel pain.  The pain is occurring at the plantar aspect of the right heel.  She was to be worse in the first few steps in the morning or if he is standing for prolonged periods of time. ? ? ? ?Review of Systems ?See HPI  ? ?HISTORY: Past Medical, Surgical, Social, and Family History Reviewed & Updated per EMR.   ?Pertinent Historical Findings include: ? ?Past Medical History:  ?Diagnosis Date  ? Anxiety   ? DVT (deep vein thrombosis) in pregnancy   ? Gastroesophageal reflux disease   ? Hypertension   ? Lupus anticoagulant positive   ? ? ?Past Surgical History:  ?Procedure Laterality Date  ? Ankle fracture repair    ? VASECTOMY    ? ? ? ?PHYSICAL EXAM:  ?VS: Ht 5\' 8"  (1.727 m)   Wt 239 lb (108.4 kg)   BMI 36.34 kg/m?  ?Physical Exam ?Gen: NAD, alert, cooperative with exam, well-appearing ?MSK:  ?Neurovascularly intact   ? ?Patient was fitted for a standard, cushioned, semi-rigid orthotic. ?The orthotic was heated and afterward the patient stood on the orthotic blank positioned on the orthotic stand. ?The patient was positioned in subtalar neutral position and 10 degrees of ankle dorsiflexion in a weight bearing stance. ?After completion of molding, a stable base was applied to the orthotic blank. ?The blank was ground to a stable position for weight bearing. ?Size: 9 ?Pairs: 2 ?Base: [facility] EVA ?Additional Posting and Padding: None ?The patient ambulated these, and they were very comfortable. ? ? ? ? ?ASSESSMENT & PLAN:  ? ?Plantar fasciitis, right ?Acutely occurring.  Symptoms most consistent with Planter fasciitis. ?-Counseled on home exercise therapy and supportive care. ?-Orthotics. ? ? ? ? ?

## 2021-08-30 NOTE — Assessment & Plan Note (Signed)
Acutely occurring.  Symptoms most consistent with Planter fasciitis. ?-Counseled on home exercise therapy and supportive care. ?-Orthotics. ? ?

## 2021-09-29 ENCOUNTER — Encounter: Payer: Self-pay | Admitting: Cardiology

## 2021-09-29 MED ORDER — TADALAFIL 10 MG PO TABS: 10.0000 mg | ORAL_TABLET | Freq: Every day | ORAL | 1 refills | Status: DC | PRN

## 2021-10-02 ENCOUNTER — Ambulatory Visit: Payer: 59 | Admitting: Family Medicine

## 2021-10-02 ENCOUNTER — Encounter: Payer: Self-pay | Admitting: Family Medicine

## 2021-10-02 VITALS — BP 132/68 | HR 78 | Resp 16 | Ht 68.0 in | Wt 235.0 lb

## 2021-10-02 DIAGNOSIS — J0100 Acute maxillary sinusitis, unspecified: Secondary | ICD-10-CM

## 2021-10-02 DIAGNOSIS — R7309 Other abnormal glucose: Secondary | ICD-10-CM

## 2021-10-02 DIAGNOSIS — E291 Testicular hypofunction: Secondary | ICD-10-CM

## 2021-10-02 LAB — POCT GLYCOSYLATED HEMOGLOBIN (HGB A1C): Hemoglobin A1C: 5.2 % (ref 4.0–5.6)

## 2021-10-02 MED ORDER — TESTOSTERONE 20.25 MG/ACT (1.62%) TD GEL: 2.0000 | TRANSDERMAL | 3 refills | Status: DC

## 2021-10-02 MED ORDER — AMOXICILLIN 875 MG PO TABS
875.0000 mg | ORAL_TABLET | Freq: Two times a day (BID) | ORAL | 0 refills | Status: AC
Start: 2021-10-02 — End: 2021-10-09

## 2021-10-02 NOTE — Progress Notes (Signed)
Acute Office Visit  Subjective:     Patient ID: Derek Pollard, male    DOB: 09/17/1960, 61 y.o.   MRN: [id number]  Chief Complaint  Patient presents with   Cough    Productive cough, chest/nasal congestion, facial pressure/pain 5 days. Covid test negative yesterday    HPI Patient is in today for Productive cough, chest/nasal congestion, facial pressure/pain > 1 week.  Covid test negative yesterday.  Reports has been using some over-the-counter cough and cold medications as well as allergy medications and nasal sprays without significant relief.  Abnormal glucose-would like to repeat glucose levels.  Still doing testosterone injections for hypogonadism but he is very interested in doing topicals they have tried to get this approved in the past but had difficulty.  I am more than happy to try again.  ROS      Objective:    BP 132/68   Pulse 78   Resp 16   Ht 5\' 8"  (1.727 m)   Wt 235 lb (106.6 kg)   SpO2 100%   BMI 35.73 kg/m     Physical Exam Constitutional:      Appearance: He is well-developed.  HENT:     Head: Normocephalic and atraumatic.     Right Ear: External ear normal.     Left Ear: External ear normal.     Nose: Nose normal.  Eyes:     Conjunctiva/sclera: Conjunctivae normal.     Pupils: Pupils are equal, round, and reactive to light.  Neck:     Thyroid: No thyromegaly.  Cardiovascular:     Rate and Rhythm: Normal rate.     Heart sounds: Normal heart sounds.  Pulmonary:     Effort: Pulmonary effort is normal.     Breath sounds: Normal breath sounds.  Musculoskeletal:     Cervical back: Neck supple.  Lymphadenopathy:     Cervical: No cervical adenopathy.  Skin:    General: Skin is warm and dry.  Neurological:     Mental Status: He is alert and oriented to person, place, and time.     Results for orders placed or performed in visit on 10/02/21  POCT HgB A1C  Result Value Ref Range   Hemoglobin A1C 5.2 4.0 - 5.6 %   HbA1c POC (<> result,  manual entry)     HbA1c, POC (prediabetic range)     HbA1c, POC (controlled diabetic range)          Assessment & Plan:   Problem List Items Addressed This Visit       Endocrine   Hypogonadism in male    We will see if insurance will cover AndroGel.  He is to have 2 documented low testosterone levels before starting the medication to cover it.  [facility] 228      Relevant Medications   Testosterone 20.25 MG/ACT (1.62%) GEL   Other Visit Diagnoses     Acute non-recurrent maxillary sinusitis    -  Primary   Relevant Medications   amoxicillin (AMOXIL) 875 MG tablet   Abnormal glucose       Relevant Orders   POCT HgB A1C (Completed)      Acute sinusitis with symptoms greater than 1 week not improving-we will go ahead and treat with amoxicillin.  Okay to continue symptomatic care.  Call if not better in 1 week.  Abnormal glucose-A1c today looks great no sign of  diabetes or prediabetes.  He was potentially interested in weight loss medications but his current insurance plan does not cover any of the newer weight loss medications. We did discussed that prescribing a diabetes drug can trigger a flag to his insurance that he may have diabetes.   Meds ordered this encounter  Medications   amoxicillin (AMOXIL) 875 MG tablet    Sig: Take 1 tablet (875 mg total) by mouth 2 (two) times daily for 7 days.    Dispense:  14 tablet    Refill:  0   Testosterone 20.25 MG/ACT (1.62%) GEL    Sig: Place 2 Pump onto the skin every morning. 1 pump to each upper outer arm.    Dispense:  75 g    Refill:  3    No follow-ups on file.  Beatrice Lecher, MD

## 2021-10-02 NOTE — Assessment & Plan Note (Signed)
We will see if insurance will cover AndroGel.  He is to have 2 documented low testosterone levels before starting the medication to cover it.  [facility], [facility] Test[facility] on October 12, 2013 [facility] was 228

## 2021-10-04 ENCOUNTER — Telehealth: Payer: Self-pay

## 2021-10-04 NOTE — Telephone Encounter (Addendum)
Initiated Prior authorization [id number] 1.62% gel Via: Covermymeds Case/Key:[id number] Status: approved  as of 10/04/21 Reason:approved for 36 months  Notified Pt via: Mychart

## 2021-10-04 NOTE — Telephone Encounter (Signed)
Erroneous error

## 2021-10-09 ENCOUNTER — Ambulatory Visit (INDEPENDENT_AMBULATORY_CARE_PROVIDER_SITE_OTHER): Payer: 59

## 2021-10-09 ENCOUNTER — Encounter: Payer: Self-pay | Admitting: Family Medicine

## 2021-10-09 ENCOUNTER — Ambulatory Visit: Payer: 59 | Admitting: Sports Medicine

## 2021-10-09 DIAGNOSIS — M722 Plantar fascial fibromatosis: Secondary | ICD-10-CM

## 2021-10-09 MED ORDER — METHYLPREDNISOLONE 4 MG PO TBPK: ORAL_TABLET | ORAL | 0 refills | Status: DC

## 2021-10-09 NOTE — Progress Notes (Signed)
    Procedures performed today:    Procedure: Real-time Ultrasound Guided injection of the right plantar fascia origin Device: Samsung HS60  Verbal informed consent obtained.  Time-out conducted.  Noted no overlying erythema, induration, or other signs of local infection.  Skin prepped in a sterile fashion.  Local anesthesia: Topical Ethyl chloride.  With sterile technique and under real time ultrasound guidance: Noted thickened plantar fascia, 1 cc Kenalog 40, 1 cc lidocaine, 1 cc bupivacaine injected easily Completed without difficulty  Advised to call if fevers/chills, erythema, induration, drainage, or persistent bleeding.  Images permanently stored and available for review in PACS.  Impression: Technically successful ultrasound guided injection.  Independent interpretation of notes and tests performed by another provider:   None.  Brief History, Exam, Impression, and Recommendations:    Plantar fasciitis, right Pleasant 61 year old male, classic plantar fasciitis with plantar calcaneal spur on x-rays. Did well initially with custom orthotics but not sufficiently better, then [place], severe recurrence of pain. Its not improving, so today we did an injection. Return to see me in a month. MRI if not better.    ___________________________________________ Ihor Austin. Benjamin Stain, M.D., ABFM., CAQSM. Primary Care and Sports Medicine [facility]  Adjunct Instructor of Family Medicine  [employer]

## 2021-10-09 NOTE — Assessment & Plan Note (Signed)
Pleasant 61 year old male, classic plantar fasciitis with plantar calcaneal spur on x-rays. Did well initially with custom orthotics but not sufficiently better, then [place], severe recurrence of pain. Its not improving, so today we did an injection. Return to see me in a month. MRI if not better.

## 2021-10-17 ENCOUNTER — Encounter (INDEPENDENT_AMBULATORY_CARE_PROVIDER_SITE_OTHER): Payer: 59 | Admitting: Sports Medicine

## 2021-10-17 DIAGNOSIS — M722 Plantar fascial fibromatosis: Secondary | ICD-10-CM | POA: Diagnosis not present

## 2021-10-17 NOTE — Telephone Encounter (Signed)
I spent 5 total minutes of online digital evaluation and management services in this patient-initiated request for online care. 

## 2021-10-17 NOTE — Assessment & Plan Note (Signed)
 Planter fasciitis, plantar spur on x-rays, started with custom orthotics and other conservative treatments greater than 6 weeks but did not have much improvement, we did an injection about a week ago without significant improvement. Proceeding with MRI of the right heel.

## 2021-10-19 ENCOUNTER — Other Ambulatory Visit: Payer: Self-pay | Admitting: Sports Medicine

## 2021-10-19 DIAGNOSIS — Z0189 Encounter for other specified special examinations: Secondary | ICD-10-CM

## 2021-10-23 ENCOUNTER — Ambulatory Visit (INDEPENDENT_AMBULATORY_CARE_PROVIDER_SITE_OTHER): Payer: 59

## 2021-10-23 ENCOUNTER — Ambulatory Visit: Payer: 59

## 2021-10-23 DIAGNOSIS — M722 Plantar fascial fibromatosis: Secondary | ICD-10-CM | POA: Diagnosis not present

## 2021-11-20 ENCOUNTER — Ambulatory Visit: Payer: 59 | Admitting: Sports Medicine

## 2022-02-01 ENCOUNTER — Encounter: Payer: Self-pay | Admitting: Cardiology

## 2022-02-01 DIAGNOSIS — I10 Essential (primary) hypertension: Secondary | ICD-10-CM

## 2022-02-02 MED ORDER — IRBESARTAN 150 MG PO TABS: 150.0000 mg | ORAL_TABLET | Freq: Every day | ORAL | 3 refills | Status: DC

## 2022-02-02 NOTE — Telephone Encounter (Signed)
Aware of dr Jacalyn Lefevre recommendations.  New script sent to the pharmacy  Lab orders mailed to the pt  Follow up scheduled

## 2022-02-02 NOTE — Telephone Encounter (Signed)
DC amlodipine; add avapro 150 mg daily; bmet one week; schedule fuov  Kirk Ruths

## 2022-02-17 LAB — BASIC METABOLIC PANEL
BUN: 20 mg/dL (ref 7–25)
CO2: 24 mmol/L (ref 20–32)
Calcium: 8.8 mg/dL (ref 8.6–10.3)
Chloride: 107 mmol/L (ref 98–110)
Creat: 0.89 mg/dL (ref 0.70–1.30)
Glucose, Bld: 91 mg/dL (ref 65–99)
Potassium: 4.6 mmol/L (ref 3.5–5.3)
Sodium: 138 mmol/L (ref 135–146)

## 2022-03-04 ENCOUNTER — Other Ambulatory Visit: Payer: Self-pay | Admitting: Family Medicine

## 2022-03-04 DIAGNOSIS — E291 Testicular hypofunction: Secondary | ICD-10-CM

## 2022-03-07 NOTE — Telephone Encounter (Signed)
Please call pt and advise pt that he will require labs for refills on testosterone.

## 2022-03-07 NOTE — Telephone Encounter (Signed)
He's scheduled for labs 03/08/2022 @3 :45.tvt

## 2022-03-08 ENCOUNTER — Other Ambulatory Visit: Payer: Self-pay | Admitting: Neurology

## 2022-03-08 ENCOUNTER — Other Ambulatory Visit: Payer: 59

## 2022-03-08 DIAGNOSIS — E291 Testicular hypofunction: Secondary | ICD-10-CM

## 2022-03-09 LAB — TESTOSTERONE TOTAL,FREE,BIO, MALES
Albumin: 4.2 g/dL (ref 3.6–5.1)
Sex Hormone Binding: 41 nmol/L (ref 22–77)
Testosterone: 194 ng/dL — ABNORMAL LOW (ref 250–827)

## 2022-03-14 ENCOUNTER — Other Ambulatory Visit: Payer: Self-pay | Admitting: Family Medicine

## 2022-03-14 ENCOUNTER — Encounter: Payer: Self-pay | Admitting: Family Medicine

## 2022-03-14 ENCOUNTER — Other Ambulatory Visit: Payer: Self-pay | Admitting: Cardiology

## 2022-03-14 DIAGNOSIS — R7989 Other specified abnormal findings of blood chemistry: Secondary | ICD-10-CM

## 2022-03-14 DIAGNOSIS — I10 Essential (primary) hypertension: Secondary | ICD-10-CM

## 2022-03-14 DIAGNOSIS — Z1159 Encounter for screening for other viral diseases: Secondary | ICD-10-CM

## 2022-03-14 DIAGNOSIS — M109 Gout, unspecified: Secondary | ICD-10-CM

## 2022-03-14 DIAGNOSIS — D6859 Other primary thrombophilia: Secondary | ICD-10-CM

## 2022-03-14 DIAGNOSIS — Z125 Encounter for screening for malignant neoplasm of prostate: Secondary | ICD-10-CM

## 2022-03-14 DIAGNOSIS — R002 Palpitations: Secondary | ICD-10-CM

## 2022-03-14 DIAGNOSIS — Z0000 Encounter for general adult medical examination without abnormal findings: Secondary | ICD-10-CM

## 2022-03-14 NOTE — Telephone Encounter (Signed)
We can definitely do that.  Yes routine.  PSA is already on the other labs that were ordered so we do not need to reorder.

## 2022-03-14 NOTE — Progress Notes (Signed)
HI Derek Pollard, your  total testosterone is low.  So would like to recheck it in a couple of weeks to confirm if it still low then it will confirm whether or not you might have hypogonadism which could explain the low testosterone levels and we can discuss possible treatment options at that time.  I did go ahead and place new lab orders for you to go in a couple of weeks.

## 2022-03-14 NOTE — Progress Notes (Signed)
HPI:FU palpitations.  Echocardiogram November 2021 showed normal LV function, possible small PFO.  TSH November 2021 1.7.  Patient sent strips associated with palpitations and it showed sinus with PVCs.  Renal Dopplers February 2022 showed no renal artery stenosis.  Calcium score November 2022 166 which was 80th percentile.  Since last seen he denies the knee, chest pain, palpitations or syncope.  He has had dizziness with standing since initiated on Avapro.  Current Outpatient Medications  Medication Sig Dispense Refill   allopurinol (ZYLOPRIM) 300 MG tablet TAKE 1 TABLET DAILY 90 tablet 0   carvedilol (COREG) 25 MG tablet TAKE 1 TABLET TWICE A DAY 180 tablet 3   escitalopram (LEXAPRO) 20 MG tablet Take 1 tablet (20 mg total) by mouth daily. Needs appt 30 tablet 0   irbesartan (AVAPRO) 150 MG tablet Take 1 tablet (150 mg total) by mouth daily. 90 tablet 3   Misc. Devices MISC Start AutoCPAP at 5-15 cm. water pressure.  AutoCPAP machine with a mask and supplies for severe OSA with AHI 49.  Face mask per patient preference.     rosuvastatin (CRESTOR) 40 MG tablet Take 1 tablet (40 mg total) by mouth daily. 90 tablet 3   tadalafil (CIALIS) 10 MG tablet Take 1 tablet (10 mg total) by mouth daily as needed for erectile dysfunction. 30 tablet 1   Testosterone 1.62 % GEL Place 2 Pumps onto the skin every morning, 1 pump to each upper outer arm. 75 g 0   No current facility-administered medications for this visit.     Past Medical History:  Diagnosis Date   Anxiety    DVT (deep vein thrombosis) in pregnancy    Gastroesophageal reflux disease    Hypertension    Lupus anticoagulant positive     Past Surgical History:  Procedure Laterality Date   Ankle fracture repair     VASECTOMY      Social History   Socioeconomic History   Marital status: Married    Spouse name: Not on file   Number of children: 5   Years of education: Not on file   Highest education level: Not on file   Occupational History   Not on file  Tobacco Use   Smoking status: Former    Packs/day: 0.25    Years: 1.00    Total pack years: 0.25    Types: Cigarettes    Passive exposure: Never   Smokeless tobacco: Never  Vaping Use   Vaping Use: Never used  Substance and Sexual Activity   Alcohol use: Not Currently    Alcohol/week: 28.0 standard drinks of alcohol    Types: 28 Cans of beer per week    Comment: 4 beers per day   Drug use: Never   Sexual activity: Yes  Other Topics Concern   Not on file  Social History Narrative   Not on file   Social Determinants of Health   Financial Resource Strain: Not on file  Food Insecurity: Not on file  Transportation Needs: Not on file  Physical Activity: Not on file  Stress: Not on file  Social Connections: Not on file  Intimate Partner Violence: Not on file    Family History  Problem Relation Age of Onset   Hypertension Mother    Throat cancer Father    Stroke Sister    Heart attack Maternal Grandfather    Heart attack Paternal Grandfather     ROS: Some difficulties with fatigue and diarrhea but no  fevers or chills, productive cough, hemoptysis, dysphasia, odynophagia, melena, hematochezia, dysuria, hematuria, rash, seizure activity, orthopnea, PND, pedal edema, claudication. Remaining systems are negative.  Physical Exam: Well-developed well-nourished in no acute distress.  Skin is warm and dry.  HEENT is normal.  Neck is supple.  Chest is clear to auscultation with normal expansion.  Cardiovascular exam is regular rate and rhythm.  Abdominal exam nontender or distended. No masses palpated. Extremities show no edema. neuro grossly intact  ECG-normal sinus rhythm at a rate of 67, no ST changes.  Personally reviewed  A/P  1 coronary calcification-continue Crestor.  Patient denies chest pain.  2 history of palpitations-patient was noted to have PVCs on strips recorded with his Apple watch.  Will continue beta-blocker.  His  LV function is normal.  Symptoms are controlled.  3 hypertension-blood pressure is borderline and he is having difficulties with dizziness with standing.  Discontinue Avapro to see if his symptoms improve.  Follow blood pressure and adjust as needed.  Olga Millers, MD

## 2022-03-17 ENCOUNTER — Other Ambulatory Visit: Payer: Self-pay | Admitting: Family Medicine

## 2022-03-21 ENCOUNTER — Encounter: Payer: Self-pay | Admitting: Cardiology

## 2022-03-21 DIAGNOSIS — I2510 Atherosclerotic heart disease of native coronary artery without angina pectoris: Secondary | ICD-10-CM

## 2022-03-21 MED ORDER — ROSUVASTATIN CALCIUM 40 MG PO TABS: 40.0000 mg | ORAL_TABLET | Freq: Every day | ORAL | 3 refills | Status: DC

## 2022-03-24 LAB — COMPLETE METABOLIC PANEL WITH GFR
AG Ratio: 1.7 (calc) (ref 1.0–2.5)
ALT: 32 U/L (ref 9–46)
AST: 23 U/L (ref 10–35)
Albumin: 3.8 g/dL (ref 3.6–5.1)
Alkaline phosphatase (APISO): 67 U/L (ref 35–144)
BUN: 20 mg/dL (ref 7–25)
CO2: 26 mmol/L (ref 20–32)
Calcium: 8.8 mg/dL (ref 8.6–10.3)
Chloride: 105 mmol/L (ref 98–110)
Creat: 0.92 mg/dL (ref 0.70–1.30)
Globulin: 2.2 g/dL (calc) (ref 1.9–3.7)
Glucose, Bld: 118 mg/dL — ABNORMAL HIGH (ref 65–99)
Potassium: 4.1 mmol/L (ref 3.5–5.3)
Sodium: 139 mmol/L (ref 135–146)
Total Bilirubin: 1.2 mg/dL (ref 0.2–1.2)
Total Protein: 6 g/dL — ABNORMAL LOW (ref 6.1–8.1)
eGFR: 96 mL/min/{1.73_m2} (ref >=60)

## 2022-03-24 LAB — CBC WITH DIFFERENTIAL/PLATELET
Absolute Monocytes: 435 cells/uL (ref 200–950)
Basophils Absolute: 60 cells/uL (ref 0–200)
Basophils Relative: 1.2 %
Eosinophils Absolute: 325 cells/uL (ref 15–500)
Eosinophils Relative: 6.5 %
HCT: 43.3 % (ref 38.5–50.0)
Hemoglobin: 14.8 g/dL (ref 13.2–17.1)
Lymphs Abs: 1520 cells/uL (ref 850–3900)
MCH: 31.9 pg (ref 27.0–33.0)
MCHC: 34.2 g/dL (ref 32.0–36.0)
MCV: 93.3 fL (ref 80.0–100.0)
MPV: 9.6 fL (ref 7.5–12.5)
Monocytes Relative: 8.7 %
Neutro Abs: 2660 cells/uL (ref 1500–7800)
Neutrophils Relative %: 53.2 %
Platelets: 152 10*3/uL (ref 140–400)
RBC: 4.64 10*6/uL (ref 4.20–5.80)
RDW: 11.9 % (ref 11.0–15.0)
Total Lymphocyte: 30.4 %
WBC: 5 10*3/uL (ref 3.8–10.8)

## 2022-03-24 LAB — HEPATITIS C ANTIBODY: Hepatitis C Ab: NONREACTIVE

## 2022-03-24 LAB — IRON,TIBC AND FERRITIN PANEL
%SAT: 44 % (calc) (ref 20–48)
Ferritin: 585 ng/mL — ABNORMAL HIGH (ref 38–380)
Iron: 129 ug/dL (ref 50–180)
TIBC: 294 mcg/dL (calc) (ref 250–425)

## 2022-03-24 LAB — TSH: TSH: 2.02 mIU/L (ref 0.40–4.50)

## 2022-03-24 LAB — LIPID PANEL W/REFLEX DIRECT LDL
Cholesterol: 139 mg/dL (ref <200)
HDL: 72 mg/dL (ref >=40)
LDL Cholesterol (Calc): 50 mg/dL (calc)
Non-HDL Cholesterol (Calc): 67 mg/dL (calc) (ref <130)
Total CHOL/HDL Ratio: 1.9 (calc) (ref <5.0)
Triglycerides: 91 mg/dL (ref <150)

## 2022-03-24 LAB — PROLACTIN: Prolactin: 9.5 ng/mL (ref 2.0–18.0)

## 2022-03-24 LAB — TESTOSTERONE TOTAL,FREE,BIO, MALES
Albumin: 3.8 g/dL (ref 3.6–5.1)
Sex Hormone Binding: 35 nmol/L (ref 22–77)
Testosterone, Bioavailable: 191.4 ng/dL (ref 110.0–575.0)
Testosterone, Free: 109.2 pg/mL (ref 46.0–224.0)
Testosterone: 746 ng/dL (ref 250–827)

## 2022-03-24 LAB — HIV ANTIBODY (ROUTINE TESTING W REFLEX): HIV 1&2 Ab, 4th Generation: NONREACTIVE

## 2022-03-24 LAB — PSA: PSA: 0.46 ng/mL (ref <=4.00)

## 2022-03-24 LAB — LUTEINIZING HORMONE: LH: 0.2 m[IU]/mL — ABNORMAL LOW (ref 1.5–9.3)

## 2022-03-26 NOTE — Progress Notes (Signed)
We will review labs during appointment.

## 2022-03-26 NOTE — Progress Notes (Signed)
Derek Pollard, are all metabolic panel is okay.  Cholesterol looks great!  Blood count is normal.  We can discuss further at your physical.

## 2022-03-28 ENCOUNTER — Ambulatory Visit: Payer: 59 | Attending: Cardiology | Admitting: Cardiology

## 2022-03-28 ENCOUNTER — Encounter: Payer: Self-pay | Admitting: Cardiology

## 2022-03-28 VITALS — BP 110/72 | HR 67 | Ht 68.0 in | Wt 225.8 lb

## 2022-03-28 DIAGNOSIS — R002 Palpitations: Secondary | ICD-10-CM | POA: Diagnosis not present

## 2022-03-28 DIAGNOSIS — I10 Essential (primary) hypertension: Secondary | ICD-10-CM | POA: Diagnosis not present

## 2022-03-28 DIAGNOSIS — I2510 Atherosclerotic heart disease of native coronary artery without angina pectoris: Secondary | ICD-10-CM | POA: Diagnosis not present

## 2022-03-28 MED ORDER — TADALAFIL 10 MG PO TABS: 10.0000 mg | ORAL_TABLET | Freq: Every day | ORAL | 1 refills | Status: DC | PRN

## 2022-03-28 NOTE — Patient Instructions (Signed)
Medication Instructions:   STOP IRBESARTAN  *If you need a refill on your cardiac medications before your next appointment, please call your pharmacy*   Follow-Up: At [facility], you and your health needs are our priority.  As part of our continuing mission to provide you with exceptional heart care, we have created designated Provider Care Teams.  These Care Teams include your primary Cardiologist (physician) and Advanced Practice Providers (APPs -  Physician Assistants and Nurse Practitioners) who all work together to provide you with the care you need, when you need it.  We recommend signing up for the patient portal called "MyChart".  Sign up information is provided on this After Visit Summary.  MyChart is used to connect with patients for Virtual Visits (Telemedicine).  Patients are able to view lab/test results, encounter notes, upcoming appointments, etc.  Non-urgent messages can be sent to your provider as well.   To learn more about what you can do with MyChart, go to [email or web address].    Your next appointment:   12 month(s)  The format for your next appointment:   In Person  Provider:   Olga Millers, MD

## 2022-03-30 ENCOUNTER — Encounter: Payer: Self-pay | Admitting: Cardiology

## 2022-03-30 ENCOUNTER — Other Ambulatory Visit: Payer: Self-pay | Admitting: Family Medicine

## 2022-03-30 DIAGNOSIS — E291 Testicular hypofunction: Secondary | ICD-10-CM

## 2022-03-30 MED ORDER — TADALAFIL 10 MG PO TABS: 10.0000 mg | ORAL_TABLET | Freq: Every day | ORAL | 1 refills | Status: DC | PRN

## 2022-04-02 ENCOUNTER — Encounter: Payer: Self-pay | Admitting: Cardiology

## 2022-04-02 MED ORDER — IRBESARTAN 75 MG PO TABS: 75.0000 mg | ORAL_TABLET | Freq: Every day | ORAL | 3 refills | Status: DC

## 2022-04-03 ENCOUNTER — Other Ambulatory Visit: Payer: Self-pay | Admitting: Family Medicine

## 2022-04-03 DIAGNOSIS — E291 Testicular hypofunction: Secondary | ICD-10-CM

## 2022-04-03 MED ORDER — TESTOSTERONE 1.62 % TD GEL: TRANSDERMAL | 5 refills | Status: DC

## 2022-04-03 NOTE — Progress Notes (Signed)
Meds ordered this encounter  Medications   Testosterone 1.62 % GEL    Sig: Place 2 Pumps onto the skin every morning, 1 pump to each upper outer arm.    Dispense:  75 g    Refill:  5    This prescription was filled on 02/12/2022. Any refills authorized will be placed on file.

## 2022-04-05 MED ORDER — IRBESARTAN 75 MG PO TABS: 75.0000 mg | ORAL_TABLET | Freq: Every day | ORAL | 3 refills | Status: DC

## 2022-04-05 NOTE — Addendum Note (Signed)
Addended by: Lindell Spar on: 04/05/2022 08:27 AM   Modules accepted: Orders

## 2022-04-10 ENCOUNTER — Encounter: Payer: Self-pay | Admitting: Family Medicine

## 2022-04-10 ENCOUNTER — Ambulatory Visit: Payer: 59 | Admitting: Family Medicine

## 2022-04-10 VITALS — BP 130/81 | HR 70 | Ht 68.0 in | Wt 225.0 lb

## 2022-04-10 DIAGNOSIS — R197 Diarrhea, unspecified: Secondary | ICD-10-CM

## 2022-04-10 DIAGNOSIS — R7989 Other specified abnormal findings of blood chemistry: Secondary | ICD-10-CM

## 2022-04-10 DIAGNOSIS — I10 Essential (primary) hypertension: Secondary | ICD-10-CM | POA: Diagnosis not present

## 2022-04-10 DIAGNOSIS — E291 Testicular hypofunction: Secondary | ICD-10-CM | POA: Diagnosis not present

## 2022-04-10 DIAGNOSIS — R61 Generalized hyperhidrosis: Secondary | ICD-10-CM

## 2022-04-10 DIAGNOSIS — R5383 Other fatigue: Secondary | ICD-10-CM

## 2022-04-10 DIAGNOSIS — M109 Gout, unspecified: Secondary | ICD-10-CM | POA: Diagnosis not present

## 2022-04-10 DIAGNOSIS — R7309 Other abnormal glucose: Secondary | ICD-10-CM

## 2022-04-10 NOTE — Assessment & Plan Note (Signed)
IPlan to check transferrin and evaluate for hemochromatosis.  It could be actually be causing some of his symptoms present liver enzymes were normal.

## 2022-04-10 NOTE — Assessment & Plan Note (Signed)
Testosterone was refilled on the 12th.  Encouraged him to call the pharmacy if we need to resend the prescription just let us know but it does say "receipt confirmed".   Last levels looked great!!! Recheck labs in 6 months.

## 2022-04-10 NOTE — Assessment & Plan Note (Signed)
Blood pressure looks okay today 

## 2022-04-10 NOTE — Progress Notes (Signed)
Established Patient Office Visit  Subjective   Patient ID: Derek Pollard, male    DOB: [birth year]  Age: 61 y.o. MRN: [id number]  Chief Complaint  Patient presents with   Follow-up    HPI  Derek Pollard reports that he is not been feeling well for about a month.  He has had frequent and loose stools.  Often times walk to the bathroom twice before he even leaves for work in the mornings.  He feels like he is just tired all the time he has been sleeping a lot.  Even though his sleep quality is not that great he says if he stays in bed for about 10 hours he is probably only getting about 6-1/2 hours of sleep.  He is also noticed that he has been a lot more sweaty but no fevers.  He reports he has lost a few pounds.  He also reports of burning and discomfort sensation in the epigastric area and feeling like he is getting indigestion.  He denies any recent changes to his diet he occasionally take a Pepcid and that does seem to help.  Tums does not help.  Has had a colonoscopy in the past but thinks he is due for repeat.  No blood in the stool or urine.  Stools are mostly watery and loose.  No recent tick bites.  Gout - no longer taking his allopurinol.    Had recent labs done liver enzymes and thyroid look great.  Ferritin was elevated and his blood glucose was elevated.    ROS    Objective:     BP 130/81   Pulse 70   Ht 5\' 8"  (1.727 m)   Wt 225 lb (102.1 kg)   SpO2 96%   BMI 34.21 kg/m    Physical Exam Constitutional:      Appearance: He is well-developed.  HENT:     Head: Normocephalic and atraumatic.  Cardiovascular:     Rate and Rhythm: Normal rate and regular rhythm.     Heart sounds: Normal heart sounds.  Pulmonary:     Effort: Pulmonary effort is normal.     Breath sounds: Normal breath sounds.  Abdominal:     General: Bowel sounds are normal.     Palpations: Abdomen is soft.  Skin:    General: Skin is warm and dry.  Neurological:     Mental Status: He is alert and  oriented to person, place, and time.  Psychiatric:        Behavior: Behavior normal.      No results found for any visits on 04/10/22.    The 10-year ASCVD risk score (Arnett DK, et al., 2019) is: 5.1%    Assessment & Plan:   Problem List Items Addressed This Visit       Cardiovascular and Mediastinum   Essential hypertension - Primary    Blood pressure looks okay today.      Relevant Orders   Alpha-Gal Panel   Celiac Disease Panel   Hemoglobin A1c     Endocrine   Hypogonadism in male    Testosterone was refilled on the 12th.  Encouraged him to call the pharmacy if we need to resend the prescription just let 13 know but it does say "receipt confirmed".   Last levels looked great!!! Recheck labs in 6 months.          Other   Gout    No longer on allopurinol.      Elevated ferritin  IPlan to check transferrin and evaluate for hemochromatosis.  It could be actually be causing some of his symptoms present liver enzymes were normal.      Relevant Orders   Hemochromatosis DNA-PCR(c282y,h63d)   Transferrin   Other Visit Diagnoses     Other fatigue       Relevant Orders   Alpha-Gal Panel   Celiac Disease Panel   Hemoglobin A1c   Diarrhea, unspecified type       Relevant Orders   Alpha-Gal Panel   Celiac Disease Panel   Hemoglobin A1c   Ambulatory referral to Gastroenterology   Salmonella/Shigella Cult, Campy EIA and Shiga Toxin reflex   Diaphoresis       Relevant Orders   Alpha-Gal Panel   Celiac Disease Panel   Hemoglobin A1c   Abnormal glucose       Relevant Orders   Hemoglobin A1c       Abnormal glucose-will check hemoglobin A1c.  Last A1c was 5.2 back in June.  Diarrhea with fatigue for the last month.  Will check for celiac, alpha gal, also do a stool sample to check for Salmonella and Shigella since he has had prolonged diarrhea.  Will also go and place referral to GI I think he will need further workup.  No follow-ups on file.     [name], MD

## 2022-04-10 NOTE — Assessment & Plan Note (Signed)
No longer on allopurinol.

## 2022-04-13 NOTE — Progress Notes (Signed)
Derek Pollard, negative for celiac disease.  A1c looks good no sign of diabetes.  Transferrin is also normal.  So no sign of excess iron overload.  Additional tests pending.

## 2022-04-14 LAB — SALMONELLA/SHIGELLA CULT, CAMPY EIA AND SHIGA TOXIN RFL ECOLI
MICRO NUMBER: 14346753
MICRO NUMBER:: 14346754
MICRO NUMBER:: 14346755
Result:: NOT DETECTED
SHIGA RESULT:: NOT DETECTED
SPECIMEN QUALITY: ADEQUATE
SPECIMEN QUALITY:: ADEQUATE
SPECIMEN QUALITY:: ADEQUATE

## 2022-04-17 NOTE — Progress Notes (Signed)
Final stool culture is negative.  Please let us know if you are still having persistent symptoms.

## 2022-04-19 LAB — ALPHA-GAL PANEL
Allergen, Mutton, f88: <0.1 kU/L
Allergen, Pork, f26: <0.1 kU/L
Beef: <0.1 kU/L
CLASS: 0
CLASS: 0
Class: 0
GALACTOSE-ALPHA-1,3-GALACTOSE IGE*: <0.1 kU/L (ref <0.10)

## 2022-04-19 LAB — CELIAC DISEASE PANEL
(tTG) Ab, IgA: <1 U/mL
(tTG) Ab, IgG: <1 U/mL
Gliadin IgA: <1 U/mL
Gliadin IgG: <1 U/mL
Immunoglobulin A: 213 mg/dL (ref 47–310)

## 2022-04-19 LAB — TRANSFERRIN: Transferrin: 231 mg/dL (ref 188–341)

## 2022-04-19 LAB — HEMOCHROMATOSIS DNA-PCR(C282Y,H63D)

## 2022-04-19 LAB — HEMOGLOBIN A1C
Hgb A1c MFr Bld: 5.5 % of total Hgb (ref <5.7)
Mean Plasma Glucose: 111 mg/dL
eAG (mmol/L): 6.2 mmol/L

## 2022-04-19 LAB — INTERPRETATION:

## 2022-04-20 NOTE — Progress Notes (Signed)
No sign of alpha gal so no allergy to beef mutton or pork.  You do not have hemochromatosis which is where the body actually holds onto too much iron and can cause issues.  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  But you do not have active disease.

## 2022-04-25 ENCOUNTER — Other Ambulatory Visit: Payer: Self-pay | Admitting: Family Medicine

## 2022-04-25 ENCOUNTER — Encounter: Payer: Self-pay | Admitting: Family Medicine

## 2022-04-25 MED ORDER — ESCITALOPRAM OXALATE 20 MG PO TABS: 20.0000 mg | ORAL_TABLET | Freq: Every day | ORAL | 1 refills | Status: DC

## 2022-04-27 ENCOUNTER — Encounter: Payer: 59 | Admitting: Family Medicine

## 2022-05-09 ENCOUNTER — Encounter: Payer: Self-pay | Admitting: Cardiology

## 2022-06-28 ENCOUNTER — Other Ambulatory Visit: Payer: Self-pay | Admitting: Family Medicine

## 2022-06-28 DIAGNOSIS — M109 Gout, unspecified: Secondary | ICD-10-CM

## 2022-08-06 ENCOUNTER — Encounter: Payer: Self-pay | Admitting: *Deleted

## 2022-08-18 IMAGING — CT CT CARDIAC CORONARY ARTERY CALCIUM SCORE
3 series · 14 of 20 positions shown, 16 images · non-contrast
Comparison: None.
COMPARISON: None.

Addendum:
EXAM:
OVER-READ INTERPRETATION  CT CHEST

The following report is an over-read performed by radiologist Dr.
over-read does not include interpretation of cardiac or coronary
anatomy or pathology. The calcium score interpretation by the
cardiologist is attached.
CLINICAL DATA: Cardiovascular Disease Risk stratification
Coronary Calcium Score
TECHNIQUE: A gated, non-contrast computed tomography scan of the heart was
performed using 3mm slice thickness. Axial images were analyzed on a
dedicated workstation. Calcium scoring of the coronary arteries was
performed using the Agatston method.

[Series 2: cascseq 3.0 sa36 70% (id) · axial · 0.45mm/px · z∈[-274,-184]mm · 4 of 51 slices shown]
[im 11/51  vessel]
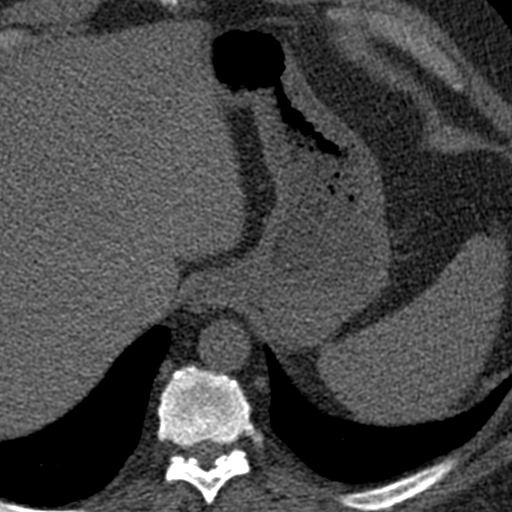
[im 21/51  vessel]
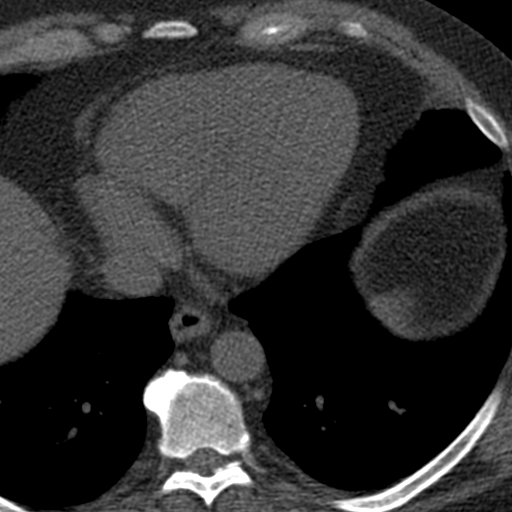
[im 31/51  vessel]
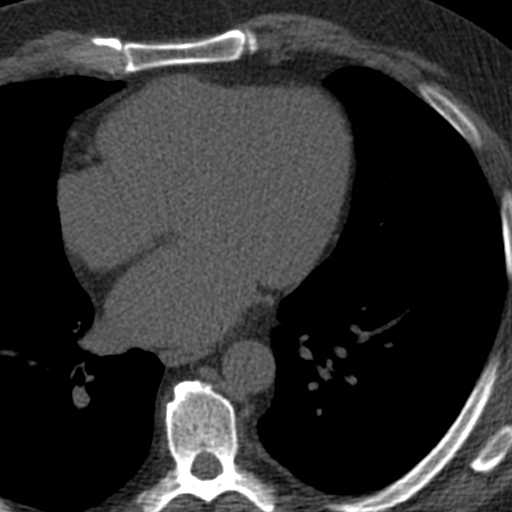
[im 41/51  vessel]
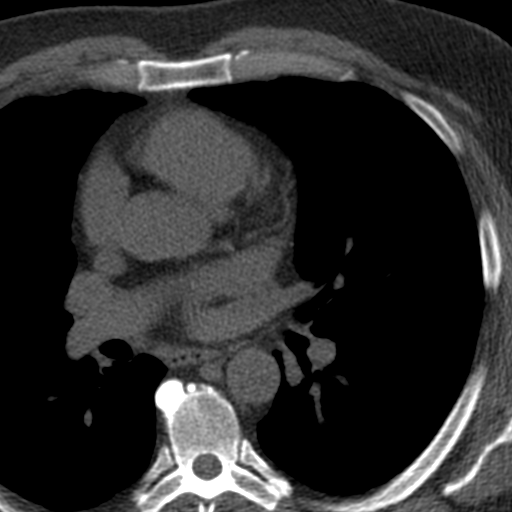

[Series 3: full fov st (id) · axial · 0.67mm/px · z∈[-280,-181]mm · 5 of 51 slices shown, 7 images]
[im 9/51  vessel]
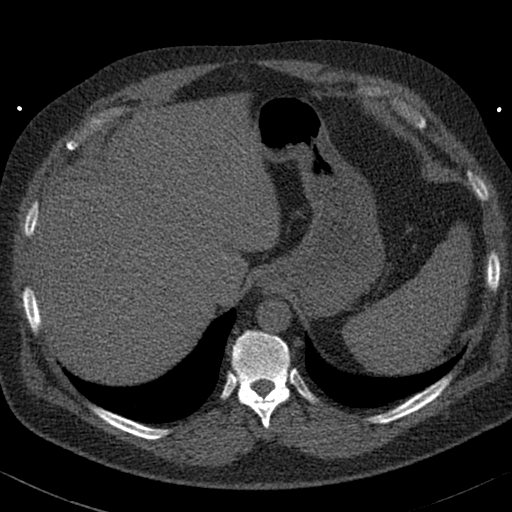
[im 9/51  lung]
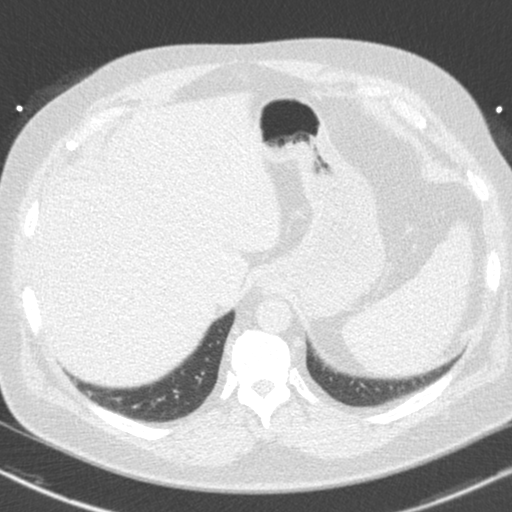
[im 17/51  vessel]
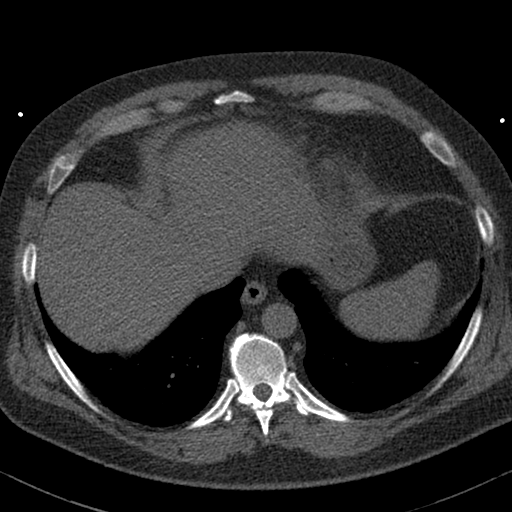
[im 26/51  vessel]
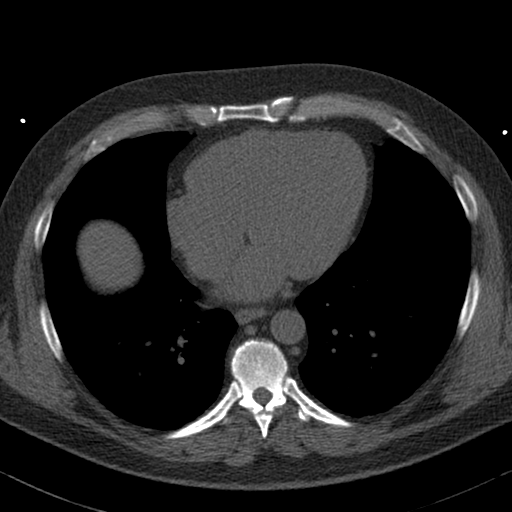
[im 34/51  vessel]
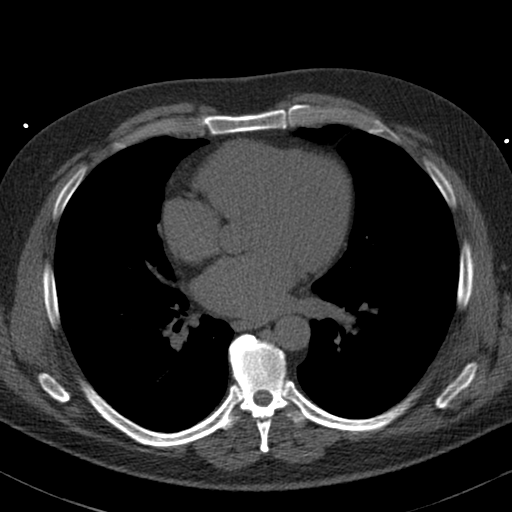
[im 42/51  vessel]
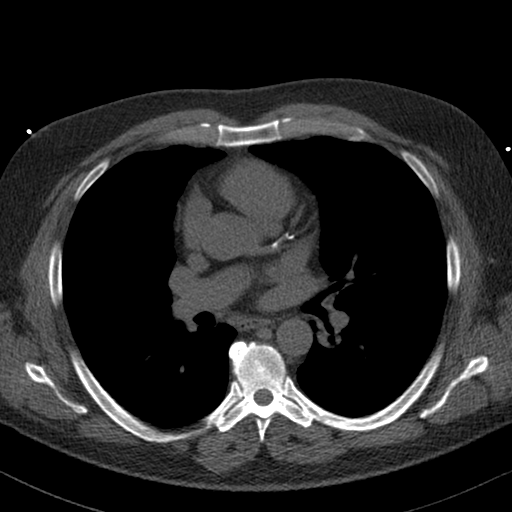
[im 42/51  lung]
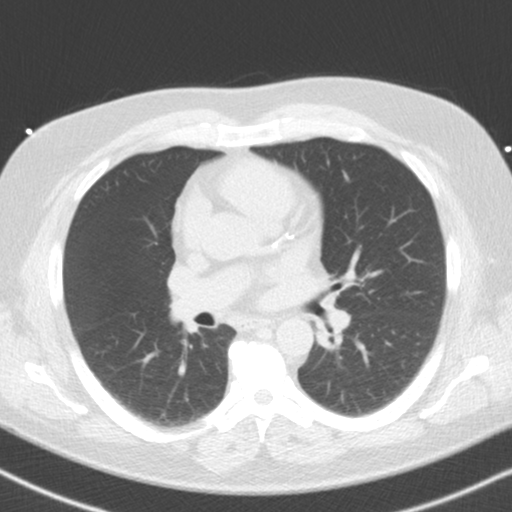

[Series 4: full fov lung · axial · 0.67mm/px · z∈[-280,-181]mm · 5 of 51 slices shown]
[im 9/51  lung]
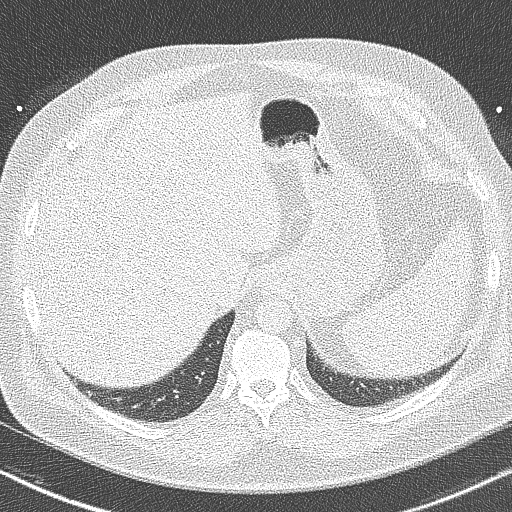
[im 17/51  lung]
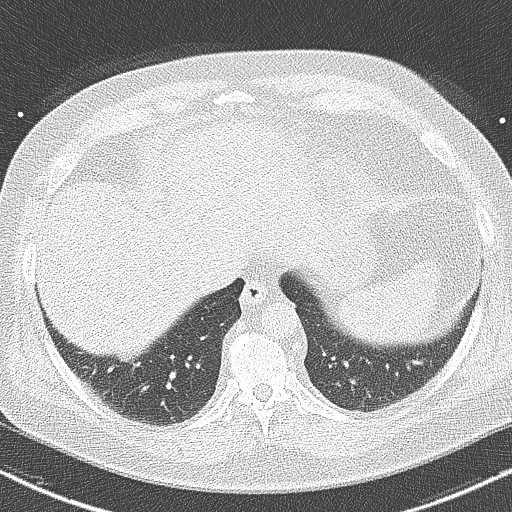
[im 26/51  lung]
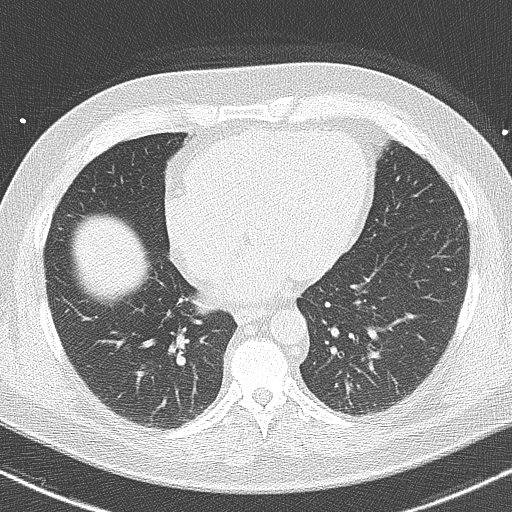
[im 34/51  lung]
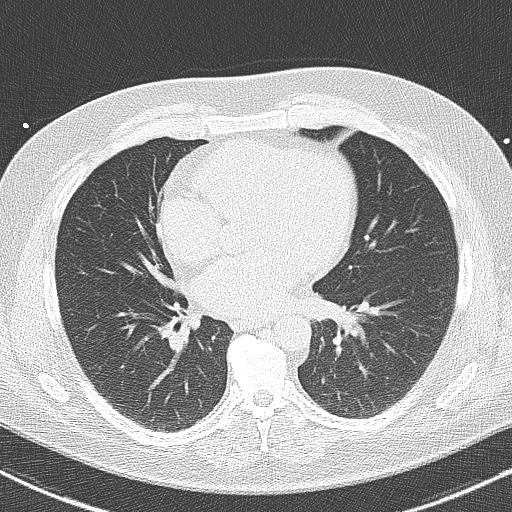
[im 42/51  lung]
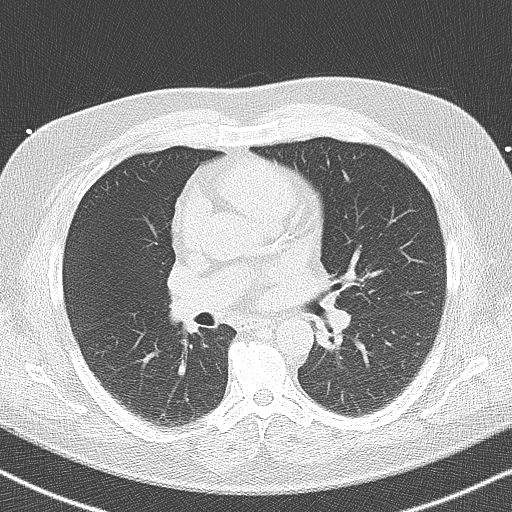

[14 of 20 positions shown; findings below may reference images not displayed]

FINDINGS: Vascular: Aortic atherosclerosis.

Mediastinum/Nodes: No imaged thoracic adenopathy.

Lungs/Pleura: No pleural fluid.  Clear imaged lungs.

Upper Abdomen: Normal imaged portions of the liver, spleen, stomach,
pancreas, adrenal glands.

Musculoskeletal: Upper and midthoracic spondylosis.
IMPRESSION: 1.  No acute findings in the imaged extracardiac chest.
2.  Aortic Atherosclerosis (73JSV-UB7.7).
FINDINGS: Coronary Calcium Score:

Left main: 0

Left anterior descending artery: 166

Left circumflex artery: 0

Right coronary artery: 0

Total: 166

Percentile: 80th

Pericardium: Normal.

Non-cardiac: See separate report from [REDACTED].
IMPRESSION: Coronary calcium score of 166. This was 80th percentile for age-,
race-, and sex-matched controls.



If CAC=0, it is reasonable to withhold statin therapy and reassess
in 5 to 10 years, as long as higher risk conditions are absent
(diabetes mellitus, family history of premature CHD in first degree
relatives (males <55 years; females <65 years), cigarette smoking,
or LDL >=190 mg/dL).

If CAC is 1 to 99, it is reasonable to initiate statin therapy for
patients >=55 years of age.

If CAC is >=100 or >=75th percentile, it is reasonable to initiate
statin therapy at any age.

Cardiology referral should be considered for patients with CAC
scores >=400 or >=75th percentile.

*7197 AHA/ACC/AACVPR/AAPA/ABC/COEUR/PEPS/GALDINO/Don Lolito/DREY/CEEJAY/CAPAS
Guideline on the Management of Blood Cholesterol: A Report of the
American College of Cardiology/American Heart Association Task Force
on Clinical Practice Guidelines. J Am Coll Cardiol.
7999;73(24):5860-5269.

*** End of Addendum ***
EXAM:
OVER-READ INTERPRETATION  CT CHEST

The following report is an over-read performed by radiologist Dr.
over-read does not include interpretation of cardiac or coronary
anatomy or pathology. The calcium score interpretation by the
cardiologist is attached.
FINDINGS: Vascular: Aortic atherosclerosis.

Mediastinum/Nodes: No imaged thoracic adenopathy.

Lungs/Pleura: No pleural fluid.  Clear imaged lungs.

Upper Abdomen: Normal imaged portions of the liver, spleen, stomach,
pancreas, adrenal glands.

Musculoskeletal: Upper and midthoracic spondylosis.
IMPRESSION: 1.  No acute findings in the imaged extracardiac chest.
2.  Aortic Atherosclerosis (73JSV-UB7.7).

## 2022-08-29 ENCOUNTER — Other Ambulatory Visit: Payer: Self-pay | Admitting: Family Medicine

## 2022-08-29 DIAGNOSIS — M109 Gout, unspecified: Secondary | ICD-10-CM

## 2022-10-18 ENCOUNTER — Other Ambulatory Visit: Payer: Self-pay | Admitting: Family Medicine

## 2022-10-18 DIAGNOSIS — E291 Testicular hypofunction: Secondary | ICD-10-CM

## 2022-10-18 NOTE — Telephone Encounter (Signed)
Please call patient: Testosterone refilled for 1 month but he is due for 51-month follow-up for blood pressure as well as labs.  These have him schedule 4 weeks out.

## 2022-10-19 NOTE — Telephone Encounter (Signed)
Called patient he is scheduled for July 29th for a follow up on BP and labs

## 2022-10-30 ENCOUNTER — Encounter: Payer: Self-pay | Admitting: Family Medicine

## 2022-10-30 ENCOUNTER — Ambulatory Visit: Payer: 59 | Admitting: Family Medicine

## 2022-10-30 VITALS — BP 94/52 | HR 65 | Temp 98.2°F | Ht 68.0 in | Wt 228.0 lb

## 2022-10-30 DIAGNOSIS — J0100 Acute maxillary sinusitis, unspecified: Secondary | ICD-10-CM

## 2022-10-30 DIAGNOSIS — I10 Essential (primary) hypertension: Secondary | ICD-10-CM

## 2022-10-30 MED ORDER — AMOXICILLIN-POT CLAVULANATE 875-125 MG PO TABS: 1.0000 | ORAL_TABLET | Freq: Two times a day (BID) | ORAL | 0 refills | Status: DC

## 2022-10-30 NOTE — Progress Notes (Signed)
Acute Office Visit  Subjective:     Patient ID: Derek Pollard, male    DOB: March 06, 1961, 62 y.o.   MRN: [id number]  Chief Complaint  Patient presents with   Cough   Fatigue    HPI Patient is in today for Sxs x 2 weeks. Facial pain pressure. Burning  in nostrils. Coughing up light yellow/green mucus. Denies any fevers. He did experience some chills.  His left ear has been hurting and he has had a mild sore throat.   Took home COVID test yesterday this was Negative. He has been taking Mucinex and Sudafed.  Also reports that home blood pressures have been intermittently low.  He been having some episodes of dizziness and thought initially was because of the Sudafed he was taking but he quit taking it and he still had a few more episodes.  He feels like he hydrates well.  No recent weight fluctuations.  ROS      Objective:    BP (!) 94/52   Pulse 65   Temp 98.2 F (36.8 C)   Ht 5\' 8"  (1.727 m)   Wt 228 lb (103.4 kg)   SpO2 98%   BMI 34.67 kg/m    Physical Exam Constitutional:      Appearance: He is well-developed.  HENT:     Head: Normocephalic and atraumatic.     Right Ear: Tympanic membrane, ear canal and external ear normal.     Left Ear: Tympanic membrane, ear canal and external ear normal.     Nose: Nose normal.     Mouth/Throat:     Pharynx: Oropharynx is clear. No oropharyngeal exudate.  Eyes:     Conjunctiva/sclera: Conjunctivae normal.     Pupils: Pupils are equal, round, and reactive to light.  Neck:     Thyroid: No thyromegaly.  Cardiovascular:     Rate and Rhythm: Normal rate.     Heart sounds: Normal heart sounds.  Pulmonary:     Effort: Pulmonary effort is normal.     Breath sounds: Normal breath sounds.  Musculoskeletal:     Cervical back: Neck supple. Tenderness present.  Lymphadenopathy:     Cervical: No cervical adenopathy.  Skin:    General: Skin is warm and dry.  Neurological:     Mental Status: He is alert and oriented to person,  place, and time.     No results found for any visits on 10/30/22.      Assessment & Plan:   Problem List Items Addressed This Visit       Cardiovascular and Mediastinum   Essential hypertension - Primary    Is hypotensive here today.  He says he has been hydrating really well he says he constantly drinks water.  We discussed splitting the carvedilol in half and taking half a tab twice a day.  Continue the irbesartan.  He will monitor over the next week.  If blood pressures continue to remain low then please let us know.  He has been experiencing some significant fatigue and I do wonder if that is related to the low blood pressures we will see if that improves if not worse certainly able to do some additional workup and labs.  He actually has a follow-up in a few weeks for his testosterone and will be due for blood work at that time.      Other Visit Diagnoses     Acute non-recurrent maxillary sinusitis       Relevant Medications  amoxicillin-clavulanate (AUGMENTIN) 875-125 MG tablet      Sinusitis-will treat with Augmentin.  Call if not significantly better in 1 week.  Meds ordered this encounter  Medications   amoxicillin-clavulanate (AUGMENTIN) 875-125 MG tablet    Sig: Take 1 tablet by mouth 2 (two) times daily.    Dispense:  14 tablet    Refill:  0    No follow-ups on file.  [name], MD

## 2022-10-30 NOTE — Assessment & Plan Note (Signed)
Is hypotensive here today.  He says he has been hydrating really well he says he constantly drinks water.  We discussed splitting the carvedilol in half and taking half a tab twice a day.  Continue the irbesartan.  He will monitor over the next week.  If blood pressures continue to remain low then please let us know.  He has been experiencing some significant fatigue and I do wonder if that is related to the low blood pressures we will see if that improves if not worse certainly able to do some additional workup and labs.  He actually has a follow-up in a few weeks for his testosterone and will be due for blood work at that time.

## 2022-10-30 NOTE — Progress Notes (Signed)
Sxs x 2 weeks. Facial pain pressure. Burning  in nostrils. Coughing up light yellow/green mucus. Denies any fevers. He did experience some chills.   Took home COVID test yesterday this was Negative. He has been taking Mucinex and Sudafed.

## 2022-11-19 ENCOUNTER — Encounter: Payer: Self-pay | Admitting: Family Medicine

## 2022-11-19 ENCOUNTER — Ambulatory Visit: Payer: 59 | Admitting: Family Medicine

## 2022-11-19 VITALS — BP 130/81 | HR 68 | Ht 68.0 in | Wt 234.0 lb

## 2022-11-19 DIAGNOSIS — I10 Essential (primary) hypertension: Secondary | ICD-10-CM

## 2022-11-19 DIAGNOSIS — E291 Testicular hypofunction: Secondary | ICD-10-CM

## 2022-11-19 DIAGNOSIS — M1A00X0 Idiopathic chronic gout, unspecified site, without tophus (tophi): Secondary | ICD-10-CM | POA: Diagnosis not present

## 2022-11-19 DIAGNOSIS — Z23 Encounter for immunization: Secondary | ICD-10-CM

## 2022-11-19 DIAGNOSIS — Z1211 Encounter for screening for malignant neoplasm of colon: Secondary | ICD-10-CM

## 2022-11-19 MED ORDER — TESTOSTERONE 1.62 % TD GEL
TRANSDERMAL | 5 refills | Status: DC
Start: 2022-11-19 — End: 2022-11-23

## 2022-11-19 NOTE — Assessment & Plan Note (Signed)
Pressure is a little borderline low today.  Normally it looks great.  Continue to monitor before making any changes.

## 2022-11-19 NOTE — Progress Notes (Signed)
   Established Patient Office Visit  Subjective   Patient ID: Derek Pollard, male    DOB: [birth year]  Age: 62 y.o. MRN: [id number]  Chief Complaint  Patient presents with   Hypertension    HPI  Hypertension- Pt denies chest pain, SOB, dizziness, or heart palpitations.  Taking meds as directed w/o problems.  Denies medication side effects.    F/U Chronic gout -has not had a flare in a really long time.  He is interested in either coming down or off of his allopurinol.     ROS    Objective:     BP 130/81   Pulse 68   Ht 5\' 8"  (1.727 m)   Wt 234 lb (106.1 kg)   SpO2 99%   BMI 35.58 kg/m    Physical Exam Constitutional:      Appearance: He is well-developed.  HENT:     Head: Normocephalic and atraumatic.  Cardiovascular:     Rate and Rhythm: Normal rate and regular rhythm.     Heart sounds: Normal heart sounds.  Pulmonary:     Effort: Pulmonary effort is normal.     Breath sounds: Normal breath sounds.  Skin:    General: Skin is warm and dry.  Neurological:     Mental Status: He is alert and oriented to person, place, and time.  Psychiatric:        Behavior: Behavior normal.      No results found for any visits on 11/19/22.    The 10-year ASCVD risk score (Arnett DK, et al., 2019) is: 5.1%    Assessment & Plan:   Problem List Items Addressed This Visit       Cardiovascular and Mediastinum   Essential hypertension - Primary    Pressure is a little borderline low today.  Normally it looks great.  Continue to monitor before making any changes.      Relevant Orders   PSA   CBC   Uric acid   Testosterone, Free, Total, SHBG   CMP14+EGFR     Endocrine   Hypogonadism in male    Happy with testosterone replacement therapy.  He does 1 pump to each upper outer arm daily.  Due to recheck labs.      Relevant Medications   Testosterone 1.62 % GEL   Other Relevant Orders   PSA   CBC   Uric acid   Testosterone, Free, Total, SHBG   CMP14+EGFR      Other   Gout    Hoping to either decrease or come off of allopurinol.  Has not had a flare in years.      Relevant Orders   PSA   CBC   Uric acid   Testosterone, Free, Total, SHBG   CMP14+EGFR   Other Visit Diagnoses     Screen for colon cancer       Relevant Orders   Ambulatory referral to Gastroenterology   Encounter for immunization       Relevant Orders   Tdap vaccine greater than or equal to 7yo IM (Completed)       Referral for colon cancer screening placed.  He would prefer to stay here locally if at all possible.  Tdap given today.  Return in about 6 months (around 05/22/2023) for Hypertension.    [name], MD

## 2022-11-19 NOTE — Assessment & Plan Note (Signed)
Hoping to either decrease or come off of allopurinol.  Has not had a flare in years.

## 2022-11-19 NOTE — Assessment & Plan Note (Signed)
Happy with testosterone replacement therapy.  He does 1 pump to each upper outer arm daily.  Due to recheck labs.

## 2022-11-21 ENCOUNTER — Other Ambulatory Visit: Payer: Self-pay | Admitting: Family Medicine

## 2022-11-21 ENCOUNTER — Encounter: Payer: Self-pay | Admitting: Family Medicine

## 2022-11-21 DIAGNOSIS — E291 Testicular hypofunction: Secondary | ICD-10-CM

## 2022-11-21 NOTE — Progress Notes (Signed)
Hi [name], testosterone is low.  Are you still applying 2 pumps each day?  Metabolic panel looks good.  Prostate test is normal.  Blood count is normal no sign of anemia.  Uric acid level looks great so lets try to stop your allopurinol and see how you do without it.  If you start having gout flares again we can always try adding it back.  Continue to work on a low purine diet.

## 2022-11-22 IMAGING — DX DG FOOT COMPLETE 3+V*R*
3 series · 3 of 3 positions shown · non-contrast
Comparison: None.

CLINICAL DATA: RIGHT foot injury. Felt a pop in the RIGHT foot 2
days ago. Pain near the heel.

EXAM:
RIGHT FOOT COMPLETE - 3+ VIEW

[foot ap]
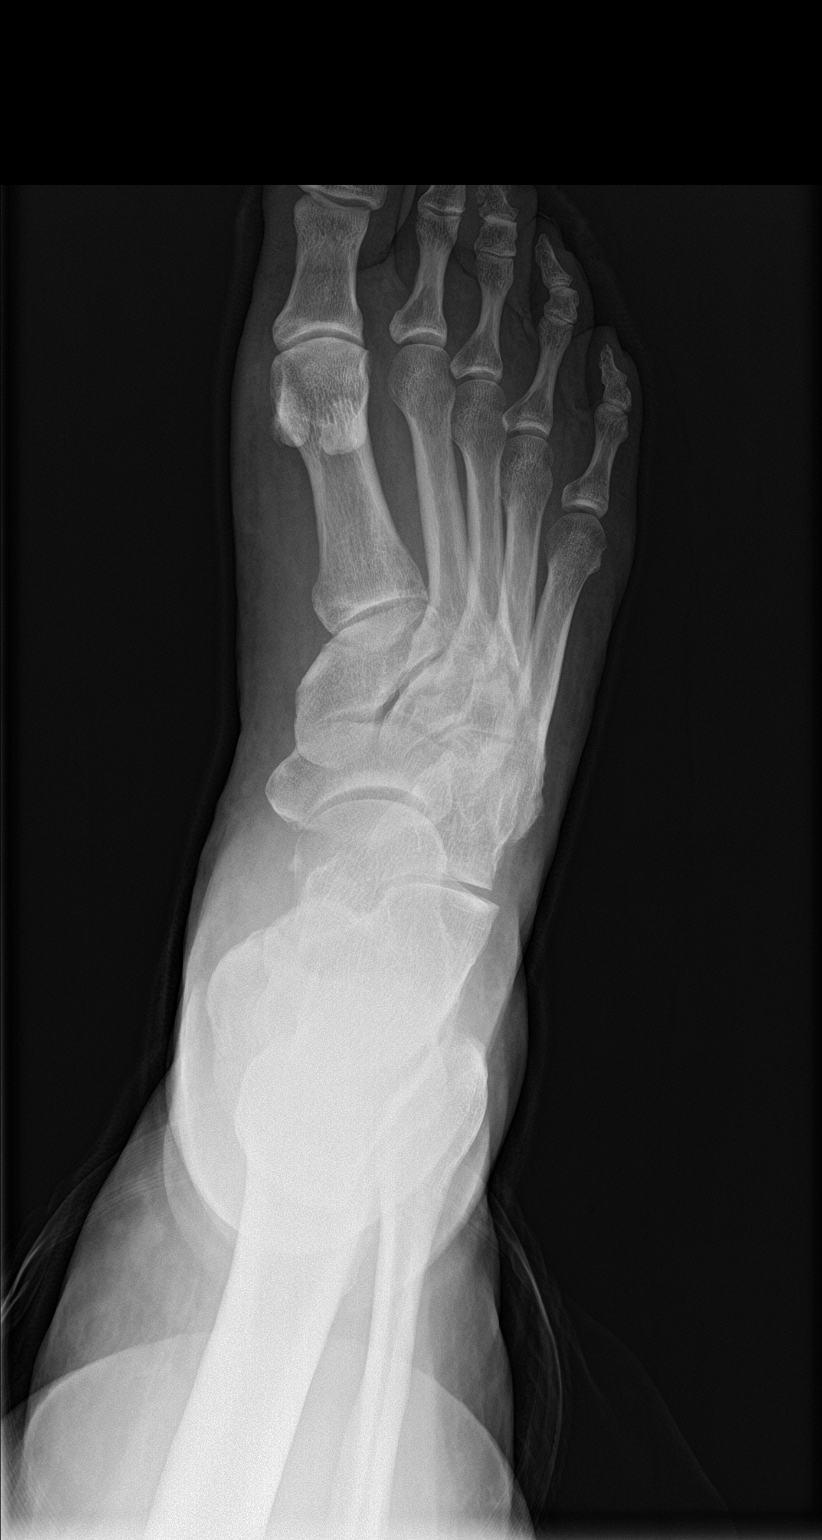

[foot obl]
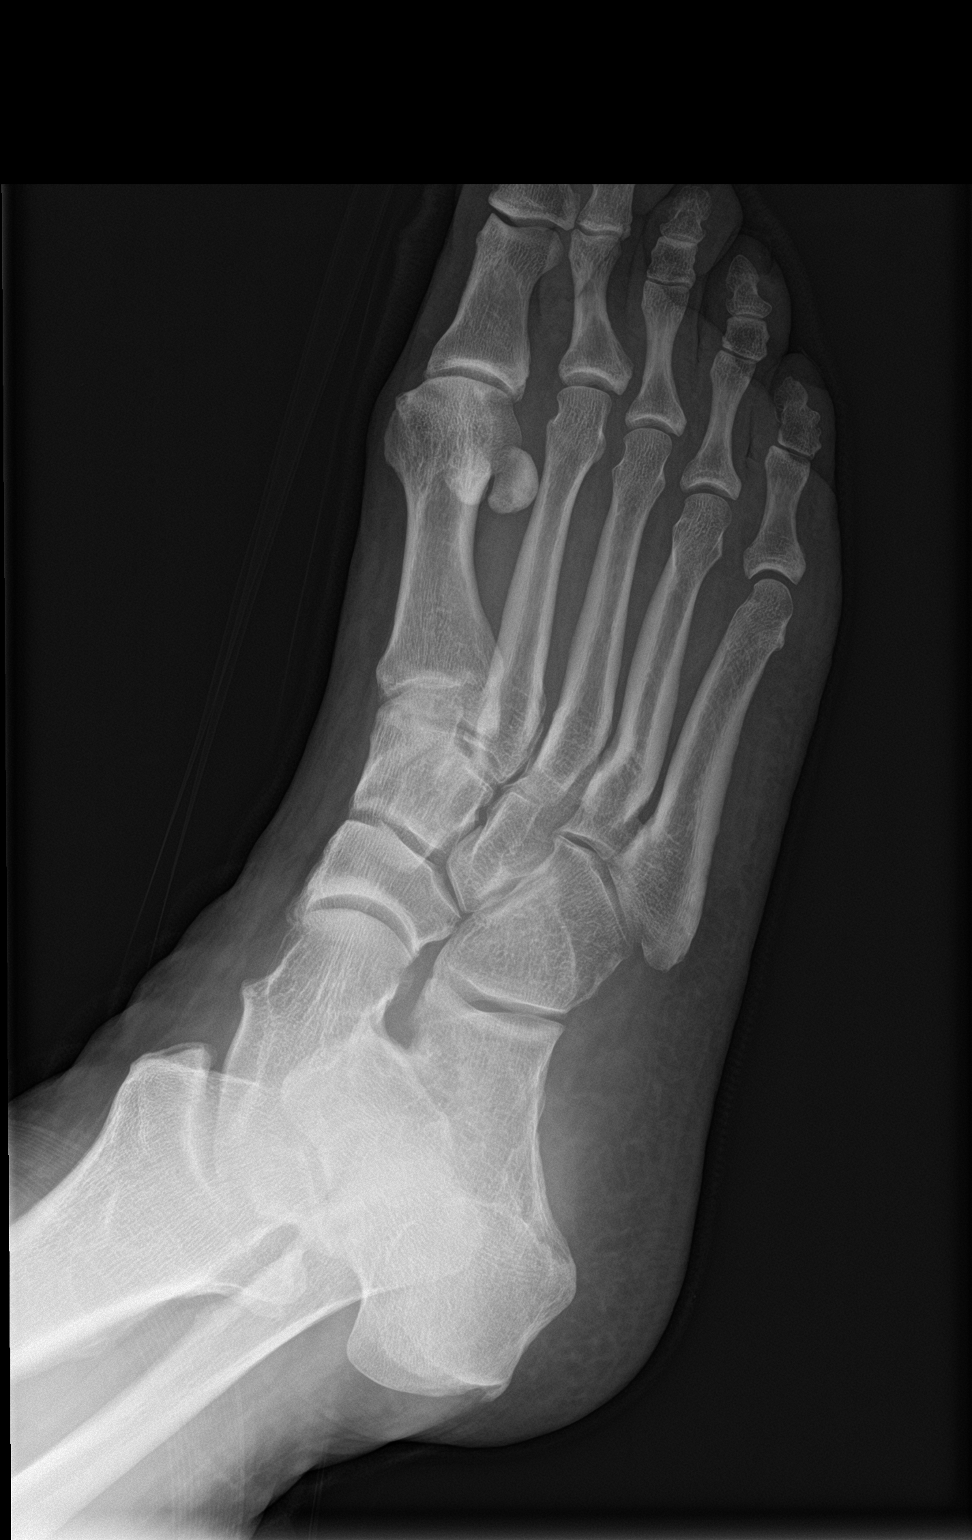

[foot lat]
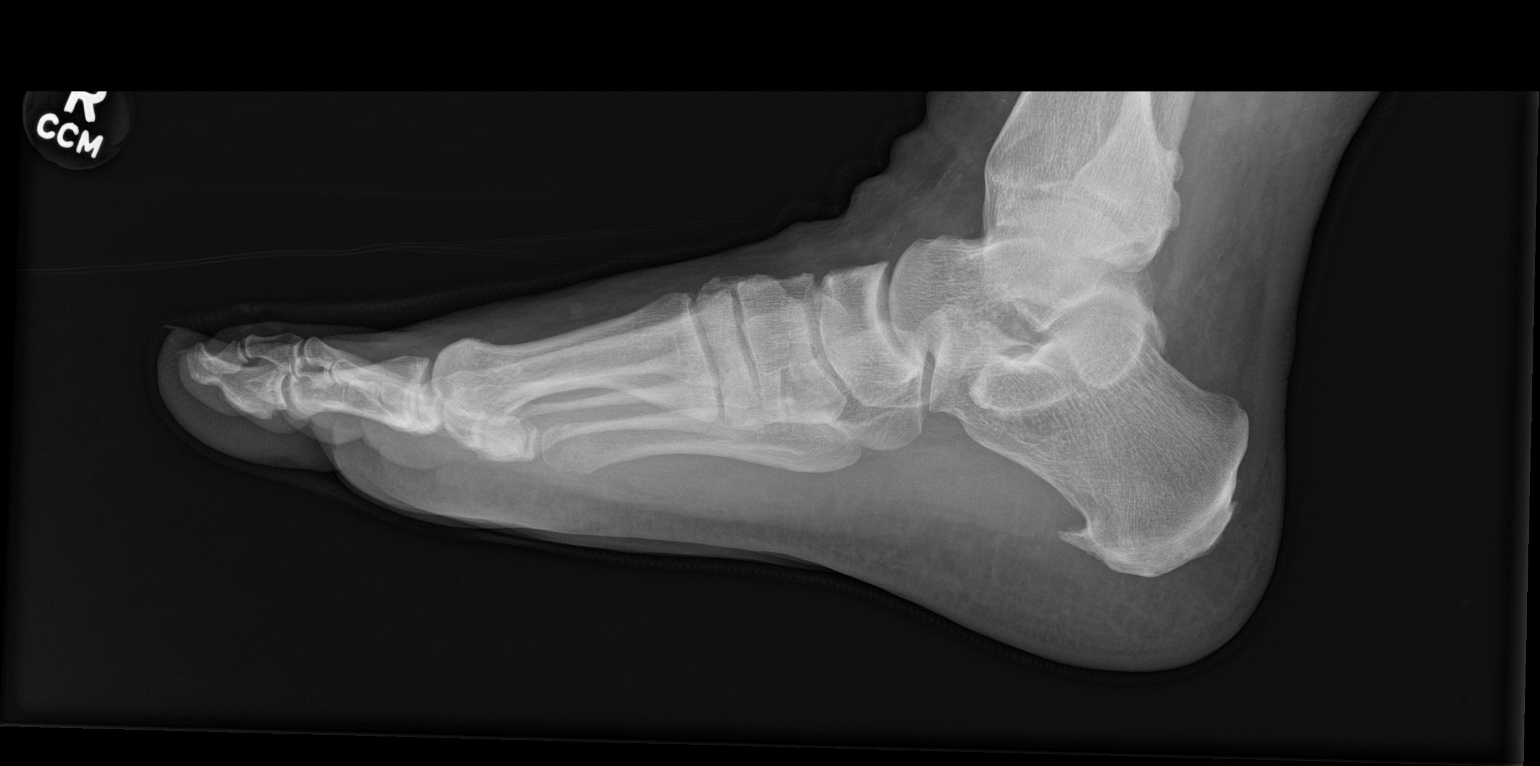

[3 of 3 positions shown; findings below may reference images not displayed]

FINDINGS: There is no acute fracture or subluxation. No radiopaque foreign
body or soft tissue gas. Calcaneal spur is present.
IMPRESSION: No evidence for acute  abnormality.    Plantar calcaneal spur.

## 2022-11-22 NOTE — Progress Notes (Signed)
Okay, based on current levels I would recommend increasing to 3 total pumps daily so 2 on 1 arm and 1 on the other arm.  We can always recheck levels again in 2 months.

## 2022-11-23 MED ORDER — TESTOSTERONE 1.62 % TD GEL
TRANSDERMAL | 3 refills | Status: DC
Start: 2022-11-23 — End: 2023-06-10

## 2022-11-23 NOTE — Telephone Encounter (Signed)
OK for 3 pumps. Sent new rx.  BUN is related to how hydrated you are so change is ok.   Meds ordered this encounter  Medications   Testosterone 1.62 % GEL    Sig: Place 3 Pumps total onto the skin (upper ourter arm) every morning.    Dispense:  150 g    Refill:  3

## 2022-11-27 ENCOUNTER — Telehealth: Payer: Self-pay | Admitting: General Practice

## 2022-11-27 ENCOUNTER — Telehealth: Payer: Self-pay | Admitting: Cardiology

## 2022-11-27 NOTE — Telephone Encounter (Signed)
Returned pt wife call. Wife called to inform Dr. Jens Som that pt passed out last night and hit the floor and was out for over a minute and she is not sure if he was even breathing at the time. Pt is getting very dizzy when standing up and it is happening often. Pt had heartburn prior to the episode as well. He was transported to [facility] ED. They triaged him and ordered EKG, Labs and an Xray. Pt was there for over 4 hours and was never seen so he left. Wife is very upset and not sleeping because they just don't know what is going on. PCP decreased his Coreg by half and then his BP starting going up so he started the whole pill again. Pt is sleeping all the time. Please advise.

## 2022-11-27 NOTE — Telephone Encounter (Signed)
Spoke with pt, Follow up scheduled  

## 2022-11-27 NOTE — Telephone Encounter (Signed)
.  SYNCOPECHMG   Pt c/o Syncope: STAT if syncope occurred within 24 hours and pt complains of lightheadedness.   High Priority if episode of passing out, completely, today or in last 24 hours   1. Did you pass out today? No    2. When is the last time you passed out? Yesterday    3. Has this occurred multiple times? No   4. Did you have any symptoms prior to passing out? Heart burn  5. Did you fall? If so, are you on a blood thinner?  No   The patient's wife called to report that the patient has been experiencing dizzy spells. The patient's primary care physician (PCP) had reduced his Coreg dosage to one pill a day. However, last night, the patient experienced heartburn and took an antacid pill. When he tried to go to the kitchen to get water, she heard him collapse and become unconscious. The patient was taken to the Emergency Department (ED) at [facility], but she feels that the patient still needs to be seen by Dr. Jens Som or an APP as soon as possible.

## 2022-11-27 NOTE — Transitions of Care (Post Inpatient/ED Visit) (Signed)
   11/27/2022  Name: Derek Pollard MRN: [id number] DOB: 01/21/1961  Today's TOC FU Call Status: Today's TOC FU Call Status:: Successful TOC FU Call Completed TOC FU Call Complete Date: 11/27/22  Transition Care Management Follow-up Telephone Call Date of Discharge: 11/27/22 Discharge Facility: Other (Non[facility]) Name of Other ([facility]) Discharge Facility: [facility] Type of Discharge: Emergency Department Reason for ED Visit: Neurologic Neurologic Diagnosis:  (syncopal episode) How have you been since you were released from the hospital?: Better Any questions or concerns?: No  Items Reviewed: Did you receive and understand the discharge instructions provided?: Yes Any new allergies since your discharge?: No Dietary orders reviewed?: NA Do you have support at home?: Yes People in Home: spouse  Medications Reviewed Today: Medications Reviewed Today     Reviewed by Modesto Charon, RN (Registered Nurse) on 11/27/22 at 1455  Med List Status: <None>   Medication Order Taking? Sig Documenting Provider Last Dose Status Informant  carvedilol (COREG) 25 MG tablet [id number] No TAKE 1 TABLET TWICE A DAY Crenshaw, Madolyn Frieze, MD Taking Active   escitalopram (LEXAPRO) 20 MG tablet [id number] No Take 1 tablet (20 mg total) by mouth daily. Agapito Games, MD Taking Active   irbesartan (AVAPRO) 75 MG tablet [id number] No Take 1 tablet (75 mg total) by mouth daily. Lewayne Bunting, MD Taking Active   Misc. Devices MISC [id number] No Start AutoCPAP at 5-15 cm. water pressure.  AutoCPAP machine with a mask and supplies for severe OSA with AHI 49.  Face mask per patient preference. [provider] Taking Active   rosuvastatin (CRESTOR) 40 MG tablet [id number] No Take 1 tablet (40 mg total) by mouth daily. Lewayne Bunting, MD Taking Active   tadalafil (CIALIS) 10 MG tablet [id number] No Take 1 tablet (10 mg total) by mouth daily as needed for erectile  dysfunction. Lewayne Bunting, MD Taking Active   Testosterone 1.62 % GEL [id number]  Place 3 Pumps total onto the skin (upper ourter arm) every morning. Agapito Games, MD  Active             Home Care and Equipment/Supplies: Were Home Health Services Ordered?: NA Any new equipment or medical supplies ordered?: NA  Functional Questionnaire: Do you need assistance with bathing/showering or dressing?: No Do you need assistance with meal preparation?: No Do you need assistance with eating?: No Do you have difficulty maintaining continence: No Do you need assistance with getting out of bed/getting out of a chair/moving?: No Do you have difficulty managing or taking your medications?: No  Follow up appointments reviewed: PCP Follow-up appointment confirmed?: Yes Date of PCP follow-up appointment?: 11/30/22 Follow-up Provider: Dr. Linford Arnold Specialist [facility] Follow-up appointment confirmed?: Yes Date of Specialist follow-up appointment?: 11/28/22 Follow-Up Specialty Provider:: Dr. Jens Som Do you need transportation to your follow-up appointment?: No Do you understand care options if your condition(s) worsen?: Yes-patient verbalized understanding  SDOH Interventions Today    Flowsheet Row Most Recent Value  SDOH Interventions   Transportation Interventions Intervention Not Indicated       SIGNATURE Modesto Charon, RN BSN Nurse Health Advisor

## 2022-11-28 ENCOUNTER — Ambulatory Visit: Payer: 59 | Attending: Cardiology | Admitting: Cardiology

## 2022-11-28 ENCOUNTER — Encounter: Payer: Self-pay | Admitting: Cardiology

## 2022-11-28 VITALS — BP 122/62 | HR 64 | Ht 68.0 in | Wt 231.4 lb

## 2022-11-28 DIAGNOSIS — I2510 Atherosclerotic heart disease of native coronary artery without angina pectoris: Secondary | ICD-10-CM | POA: Diagnosis not present

## 2022-11-28 DIAGNOSIS — I10 Essential (primary) hypertension: Secondary | ICD-10-CM | POA: Diagnosis not present

## 2022-11-28 DIAGNOSIS — R55 Syncope and collapse: Secondary | ICD-10-CM | POA: Diagnosis not present

## 2022-11-28 DIAGNOSIS — R002 Palpitations: Secondary | ICD-10-CM | POA: Diagnosis not present

## 2022-11-28 NOTE — Progress Notes (Signed)
HPI: FU palpitations and syncope.  Echocardiogram November 2021 showed normal LV function, possible small PFO.  TSH November 2021 1.7.  Patient sent strips associated with palpitations and it showed sinus with PVCs.  Renal Dopplers February 2022 showed no renal artery stenosis.  Calcium score November 2022 166 which was 80th percentile.  Contacted the office with recent syncopal episode and added to my schedule today.  At the time of this event his BNP was normal, creatinine 0.96 and troponin normal.  ECG by report normal but I do not have copy to review.  Patient was treated with IV fluids as apparently his blood pressure was low though I do not have details.  Since last seen patient has had difficulties with episodes of "dizziness".  This typically occurs 30 to 40 seconds after he changes position.  His event recently occurred after standing to walk to the kitchen.  He suddenly felt dizzy and had frank syncope.  He struck his chin.  He otherwise denies any preceding dyspnea, chest pain or palpitations.  He has had problems with abdominal tightness and diarrhea.  His symptoms of dizziness correlate with the initiation of the diarrhea.  Current Outpatient Medications  Medication Sig Dispense Refill   carvedilol (COREG) 25 MG tablet TAKE 1 TABLET TWICE A DAY 180 tablet 3   escitalopram (LEXAPRO) 20 MG tablet Take 1 tablet (20 mg total) by mouth daily. 90 tablet 1   irbesartan (AVAPRO) 75 MG tablet Take 1 tablet (75 mg total) by mouth daily. 90 tablet 3   Misc. Devices MISC Start AutoCPAP at 5-15 cm. water pressure.  AutoCPAP machine with a mask and supplies for severe OSA with AHI 49.  Face mask per patient preference.     rosuvastatin (CRESTOR) 40 MG tablet Take 1 tablet (40 mg total) by mouth daily. 90 tablet 3   tadalafil (CIALIS) 10 MG tablet Take 1 tablet (10 mg total) by mouth daily as needed for erectile dysfunction. 30 tablet 1   Testosterone 1.62 % GEL Place 3 Pumps total onto the skin  (upper ourter arm) every morning. 150 g 3   No current facility-administered medications for this visit.     Past Medical History:  Diagnosis Date   Anxiety    DVT (deep vein thrombosis) in pregnancy    Gastroesophageal reflux disease    Hypertension    Lupus anticoagulant positive     Past Surgical History:  Procedure Laterality Date   Ankle fracture repair     VASECTOMY      Social History   Socioeconomic History   Marital status: Married    Spouse name: Not on file   Number of children: 5   Years of education: Not on file   Highest education level: Not on file  Occupational History   Not on file  Tobacco Use   Smoking status: Former    Current packs/day: 0.25    Average packs/day: 0.3 packs/day for 1 year (0.3 ttl pk-yrs)    Types: Cigarettes    Passive exposure: Never   Smokeless tobacco: Never  Vaping Use   Vaping status: Never Used  Substance and Sexual Activity   Alcohol use: Not Currently    Alcohol/week: 28.0 standard drinks of alcohol    Types: 28 Cans of beer per week    Comment: 4 beers per day   Drug use: Never   Sexual activity: Yes  Other Topics Concern   Not on file  Social History Narrative  Not on file   Social Determinants of Health   Financial Resource Strain: Not on file  Food Insecurity: Not on file  Transportation Needs: No Transportation Needs (11/27/2022)   PRAPARE - Administrator, Civil Service (Medical): No    Lack of Transportation (Non-Medical): No  Physical Activity: Not on file  Stress: Not on file  Social Connections: Unknown (09/01/2021)   Received from [facility], [facility]   Social Network    Social Network: Not on file  Intimate Partner Violence: Unknown (07/24/2021)   Received from [facility], [facility]   HITS    Physically Hurt: Not on file    Insult or Talk Down To: Not on file    Threaten Physical Harm: Not on file    Scream or Curse: Not on file    Family History  Problem  Relation Age of Onset   Hypertension Mother    Throat cancer Father    Stroke Sister    Heart attack Maternal Grandfather    Heart attack Paternal Grandfather     ROS: no fevers or chills, productive cough, hemoptysis, dysphasia, odynophagia, melena, hematochezia, dysuria, hematuria, rash, seizure activity, orthopnea, PND, pedal edema, claudication. Remaining systems are negative.  Physical Exam: Well-developed well-nourished in no acute distress.  Skin is warm and dry.  HEENT is normal.  Neck is supple.  Chest is clear to auscultation with normal expansion.  Cardiovascular exam is regular rate and rhythm.  Abdominal exam nontender or distended. No masses palpated. Extremities show no edema. neuro grossly intact  EKG Interpretation Date/Time:  Wednesday November 28 2022 10:45:36 EDT Ventricular Rate:  64 PR Interval:  132 QRS Duration:  80 QT Interval:  414 QTC Calculation: 427 R Axis:   52  Text Interpretation: Normal sinus rhythm Normal ECG No previous ECGs available Confirmed by Olga Millers ([id number]) on 11/28/2022 10:47:03 AM    A/P  1 syncope-episode sounds likely to be orthostatic mediated.  He has had difficulties with diarrhea over the past 6 months and this is when his symptoms began.  We will plan to repeat his echocardiogram to make sure LV function is normal.  I have asked him to stay hydrated and to salt his food.  I will allow his blood pressure to run higher for now until evaluation of his diarrhea is complete.  We will discontinue Avapro and I have asked him to follow his blood pressure at home.  We discussed that he should not drive until this issue is settled.  2 coronary calcification-continue statin.  Patient has not had recent chest pain.  3 palpitations-previous strips recorded with his Apple Watch showed PVCs.  Continue beta-blocker.  4 hypertension-blood pressure is controlled.  However plan will be to allow his blood pressure to run higher due to  orthostatic symptoms as outlined above.  Discontinue Avapro and follow.  5 diarrhea-Per primary care.  Olga Millers, MD

## 2022-11-28 NOTE — Patient Instructions (Signed)
Medication Instructions:   STOP IRBESARTAN  *If you need a refill on your cardiac medications before your next appointment, please call your pharmacy*  Testing/Procedure:  Your physician has requested that you have an echocardiogram. Echocardiography is a painless test that uses sound waves to create images of your heart. It provides your doctor with information about the size and shape of your heart and how well your heart's chambers and valves are working. This procedure takes approximately one hour. There are no restrictions for this procedure. Please do NOT wear cologne, perfume, aftershave, or lotions (deodorant is allowed). Please arrive 15 minutes prior to your appointment time. [place] [facility] -1ST FLOOR IMAGING DEPARTMENT    Follow-Up: At [facility], you and your health needs are our priority.  As part of our continuing mission to provide you with exceptional heart care, we have created designated Provider Care Teams.  These Care Teams include your primary Cardiologist (physician) and Advanced Practice Providers (APPs -  Physician Assistants and Nurse Practitioners) who all work together to provide you with the care you need, when you need it.  We recommend signing up for the patient portal called "MyChart".  Sign up information is provided on this After Visit Summary.  MyChart is used to connect with patients for Virtual Visits (Telemedicine).  Patients are able to view lab/test results, encounter notes, upcoming appointments, etc.  Non-urgent messages can be sent to your provider as well.   To learn more about what you can do with MyChart, go to [email or web address].    Your next appointment:   8 week(s)  Provider:   Olga Millers, MD

## 2022-11-30 ENCOUNTER — Ambulatory Visit: Payer: 59 | Admitting: Family Medicine

## 2022-11-30 ENCOUNTER — Encounter: Payer: Self-pay | Admitting: Family Medicine

## 2022-11-30 VITALS — BP 135/75 | HR 70 | Ht 68.0 in | Wt 218.0 lb

## 2022-11-30 DIAGNOSIS — I10 Essential (primary) hypertension: Secondary | ICD-10-CM

## 2022-11-30 DIAGNOSIS — R197 Diarrhea, unspecified: Secondary | ICD-10-CM

## 2022-11-30 DIAGNOSIS — I951 Orthostatic hypotension: Secondary | ICD-10-CM | POA: Diagnosis not present

## 2022-11-30 NOTE — Progress Notes (Addendum)
Established Patient Office Visit  Subjective   Patient ID: Derek Pollard, male    DOB: July 28, 1960  Age: 62 y.o. MRN: [id number]  Chief Complaint  Patient presents with   Follow-up    HPI Derek Pollard is here today for follow-up from recent emergency department visit on 8/5 at [facility] .  He had been having problems with feeling dizzy particularly with position change he is also been dealing with some diarrhea on and off that may have contributed.  He did follow-up with his cardiologist yesterday who is getting an up-to-date echo and noted that blood pressures were running low and suspected orthostasis.  They have held his irbesartan and we will plan to follow-up but did ask that we address the diarrheal issue.  Note he did hit his chin when he passed out.  Saw GI yesterday as well.  They willl do some additional work up he has had a history of prior colonoscopy in 2017 and was told to repeat in 5 years.  I have ordered fecal stool calprotectin, pancreatic elastase fecal stool test Giardia screening and CRP.   Also c/o of pain in left ear.  He says it has been going on for about a week well before the fall radiating just a little bit towards his jaw.  He always feels like there is a little bit of drainage in both ears no fevers cold symptoms.  Brought in home cuff today.    Has had chronic diarrhea for years, most of her adult life with green stools. Hasn't had a formed stool in year.      ROS    Objective:     BP 135/75   Pulse 70   Ht 5\' 8"  (1.727 m)   Wt 218 lb (98.9 kg)   SpO2 97%   BMI 33.15 kg/m    Physical Exam Constitutional:      Appearance: He is well-developed.  HENT:     Head: Normocephalic and atraumatic.     Right Ear: Tympanic membrane, ear canal and external ear normal.     Left Ear: Tympanic membrane, ear canal and external ear normal.     Nose: Nose normal.     Mouth/Throat:     Pharynx: Oropharynx is clear. No oropharyngeal exudate.  Eyes:      Conjunctiva/sclera: Conjunctivae normal.     Pupils: Pupils are equal, round, and reactive to light.  Neck:     Thyroid: No thyromegaly.  Cardiovascular:     Rate and Rhythm: Normal rate and regular rhythm.     Heart sounds: Normal heart sounds.  Pulmonary:     Effort: Pulmonary effort is normal.     Breath sounds: Normal breath sounds.  Musculoskeletal:     Cervical back: Neck supple.  Lymphadenopathy:     Cervical: No cervical adenopathy.  Skin:    General: Skin is warm and dry.  Neurological:     Mental Status: He is alert and oriented to person, place, and time.  Psychiatric:        Behavior: Behavior normal.      No results found for any visits on 11/30/22.    The 10-year ASCVD risk score (Arnett DK, et al., 2019) is: 6.1%    Assessment & Plan:   Problem List Items Addressed This Visit       Cardiovascular and Mediastinum   Essential hypertension    Brought in home cuff for comparison to our machine it was about a 10 point difference on  the systolic and about a 14 point difference on the diastolic.  They may opt to get a new machine so recommended Omron or lifestyle brand.  They are welcome to bring it in for nurse visit at any point to just check the accuracy on the machine.  Reviewed some techniques for best way to check blood pressures at home.  Repeat blood pressure here today did look better after being able to sit for a few minutes.      Other Visit Diagnoses     Diarrhea, unspecified type    -  Primary   Syncope due to orthostatic hypotension          Diarrhea - working with GI to check for IBD and pancreatic insuffiencey..  colonoscopy is scheduled for  October for EGD as well.    Syncope -he is feeling much better and has not had any symptoms since holding his blood pressure pill.  Soyla Dryer is a little bit more elevated today.  He is can monitor over the weekend and let us know if it goes above 150 if it does we will most likely restart half a tab of  the irbesartan. Per cards will let blood pressure run just a little bit higher while we are still working up the diarrhea just to make sure that he is not having hypotensive orthostasis.  We did discuss that if he does have a GI disorder that is causing some malabsorption I am happy to do some additional testing to check for mineral deficiencies.  Left ear pain-unclear etiology ear exam itself looks good.  He does feel like he gets a lot of moisture and drainage from his ears we did discuss maybe a trial of Flonase or Nasonex to see if that is helpful he has some at home and it should not impact his blood pressure.  If the symptoms do not improve or get worse please let us know sooner.  No follow-ups on file.    Derek Gasser, MD

## 2022-11-30 NOTE — Assessment & Plan Note (Signed)
Brought in home cuff for comparison to our machine it was about a 10 point difference on the systolic and about a 14 point difference on the diastolic.  They may opt to get a new machine so recommended Omron or lifestyle brand.  They are welcome to bring it in for nurse visit at any point to just check the accuracy on the machine.  Reviewed some techniques for best way to check blood pressures at home.  Repeat blood pressure here today did look better after being able to sit for a few minutes.

## 2022-11-30 NOTE — Patient Instructions (Signed)
IF BPS are staying > 150 over the weekend please let me know.

## 2022-11-30 NOTE — Progress Notes (Signed)
Pt brought in home BP cuff 157/90 p:71.  He also has some pain in L ear x 10 days

## 2022-12-17 ENCOUNTER — Encounter: Payer: Self-pay | Admitting: Cardiology

## 2022-12-17 ENCOUNTER — Encounter: Payer: Self-pay | Admitting: Family Medicine

## 2022-12-17 DIAGNOSIS — I10 Essential (primary) hypertension: Secondary | ICD-10-CM

## 2022-12-31 ENCOUNTER — Ambulatory Visit (HOSPITAL_BASED_OUTPATIENT_CLINIC_OR_DEPARTMENT_OTHER): Payer: 59

## 2023-01-01 MED ORDER — CARVEDILOL 12.5 MG PO TABS
12.5000 mg | ORAL_TABLET | Freq: Two times a day (BID) | ORAL | 3 refills | Status: AC
Start: 2023-01-01 — End: ?

## 2023-01-01 NOTE — Telephone Encounter (Signed)
Left message for patient with Dr Ronnald Nian recommendations.   Message sent to patient via my chart

## 2023-01-24 NOTE — Progress Notes (Deleted)
HPI: FU palpitations and syncope.  Echocardiogram November 2021 showed normal LV function, possible small PFO.  TSH November 2021 1.7.  Patient sent strips associated with palpitations and it showed sinus with PVCs.  Renal Dopplers February 2022 showed no renal artery stenosis.  Calcium score November 2022 166 which was 80th percentile.  Contacted the office with recent syncopal episode and added to my schedule today.  At the time of this event his BNP was normal, creatinine 0.96 and troponin normal.  ECG by report normal but I do not have copy to review.  Patient was treated with IV fluids as apparently his blood pressure was low though I do not have details.  Since last seen   Current Outpatient Medications  Medication Sig Dispense Refill   carvedilol (COREG) 12.5 MG tablet Take 1 tablet (12.5 mg total) by mouth 2 (two) times daily. 180 tablet 3   escitalopram (LEXAPRO) 20 MG tablet Take 1 tablet (20 mg total) by mouth daily. 90 tablet 1   Misc. Devices MISC Start AutoCPAP at 5-15 cm. water pressure.  AutoCPAP machine with a mask and supplies for severe OSA with AHI 49.  Face mask per patient preference.     rosuvastatin (CRESTOR) 40 MG tablet Take 1 tablet (40 mg total) by mouth daily. 90 tablet 3   tadalafil (CIALIS) 10 MG tablet Take 1 tablet (10 mg total) by mouth daily as needed for erectile dysfunction. 30 tablet 1   Testosterone 1.62 % GEL Place 3 Pumps total onto the skin (upper ourter arm) every morning. 150 g 3   No current facility-administered medications for this visit.     Past Medical History:  Diagnosis Date   Anxiety    DVT (deep vein thrombosis) in pregnancy    Gastroesophageal reflux disease    Hypertension    Lupus anticoagulant positive     Past Surgical History:  Procedure Laterality Date   Ankle fracture repair     VASECTOMY      Social History   Socioeconomic History   Marital status: Married    Spouse name: Not on file   Number of children: 5    Years of education: Not on file   Highest education level: Not on file  Occupational History   Not on file  Tobacco Use   Smoking status: Former    Current packs/day: 0.25    Average packs/day: 0.3 packs/day for 1 year (0.3 ttl pk-yrs)    Types: Cigarettes    Passive exposure: Never   Smokeless tobacco: Never  Vaping Use   Vaping status: Never Used  Substance and Sexual Activity   Alcohol use: Not Currently    Alcohol/week: 28.0 standard drinks of alcohol    Types: 28 Cans of beer per week    Comment: 4 beers per day   Drug use: Never   Sexual activity: Yes  Other Topics Concern   Not on file  Social History Narrative   Not on file   Social Determinants of Health   Financial Resource Strain: Not on file  Food Insecurity: Not on file  Transportation Needs: No Transportation Needs (11/27/2022)   PRAPARE - Transportation    Lack of Transportation (Medical): No    Lack of Transportation (Non-Medical): No  Physical Activity: Not on file  Stress: Not on file  Social Connections: Unknown (09/01/2021)   Received from [facility], [facility]   Social Network    Social Network: Not on file  Intimate  Partner Violence: Unknown (07/24/2021)   Received from [facility], [facility]   HITS    Physically Hurt: Not on file    Insult or Talk Down To: Not on file    Threaten Physical Harm: Not on file    Scream or Curse: Not on file    Family History  Problem Relation Age of Onset   Hypertension Mother    Throat cancer Father    Stroke Sister    Heart attack Maternal Grandfather    Heart attack Paternal Grandfather     ROS: no fevers or chills, productive cough, hemoptysis, dysphasia, odynophagia, melena, hematochezia, dysuria, hematuria, rash, seizure activity, orthopnea, PND, pedal edema, claudication. Remaining systems are negative.  Physical Exam: Well-developed well-nourished in no acute distress.  Skin is warm and dry.  HEENT is normal.  Neck is supple.  Chest  is clear to auscultation with normal expansion.  Cardiovascular exam is regular rate and rhythm.  Abdominal exam nontender or distended. No masses palpated. Extremities show no edema. neuro grossly intact  ECG- personally reviewed  A/P  1 history of syncope-previous episode felt likely to be orthostatic mediated.  Follow-up echocardiogram showed normal LV function.  He has had no recurrences.  Will continue to follow.  2 coronary calcification-patient denies recurrent chest pain.  Continue statin.  3 hypertension-blood pressure controlled.  Continue present medical regimen.  4 history of palpitations-previous strips recorded at time of symptoms showed PVCs.  Will continue beta-blocker.  Olga Millers, MD

## 2023-02-06 ENCOUNTER — Ambulatory Visit: Payer: 59 | Admitting: Cardiology

## 2023-03-03 ENCOUNTER — Other Ambulatory Visit: Payer: Self-pay | Admitting: Cardiology

## 2023-03-03 DIAGNOSIS — I2510 Atherosclerotic heart disease of native coronary artery without angina pectoris: Secondary | ICD-10-CM

## 2023-03-18 ENCOUNTER — Other Ambulatory Visit: Payer: Self-pay | Admitting: Cardiology

## 2023-04-11 ENCOUNTER — Other Ambulatory Visit: Payer: Self-pay | Admitting: Cardiology

## 2023-04-11 ENCOUNTER — Encounter: Payer: Self-pay | Admitting: Cardiology

## 2023-04-12 MED ORDER — TADALAFIL 10 MG PO TABS: 10.0000 mg | ORAL_TABLET | Freq: Every day | ORAL | 1 refills | Status: DC | PRN

## 2023-04-12 NOTE — Addendum Note (Signed)
Addended by: Marilynn Rail on: 04/12/2023 10:18 AM   Modules accepted: Orders

## 2023-04-14 ENCOUNTER — Other Ambulatory Visit: Payer: Self-pay | Admitting: Cardiology

## 2023-04-14 DIAGNOSIS — I10 Essential (primary) hypertension: Secondary | ICD-10-CM

## 2023-04-18 ENCOUNTER — Other Ambulatory Visit: Payer: Self-pay | Admitting: Family Medicine

## 2023-05-16 NOTE — Progress Notes (Signed)
HPI: FU palpitations and syncope.  Echocardiogram November 2021 showed normal LV function, possible small PFO.  TSH November 2021 1.7.  Patient sent strips associated with palpitations and it showed sinus with PVCs.  Renal Dopplers February 2022 showed no renal artery stenosis.  Calcium score November 2022 166 which was 80th percentile.  Also with h/o syncope. Since last seen   Current Outpatient Medications  Medication Sig Dispense Refill   carvedilol (COREG) 12.5 MG tablet Take 1 tablet (12.5 mg total) by mouth 2 (two) times daily. 180 tablet 3   escitalopram (LEXAPRO) 20 MG tablet Take 1 tablet (20 mg total) by mouth daily. 90 tablet 1   Misc. Devices MISC Start AutoCPAP at 5-15 cm. water pressure.  AutoCPAP machine with a mask and supplies for severe OSA with AHI 49.  Face mask per patient preference.     rosuvastatin (CRESTOR) 40 MG tablet TAKE 1 TABLET DAILY 90 tablet 1   tadalafil (CIALIS) 10 MG tablet Take 1 tablet (10 mg total) by mouth daily as needed for erectile dysfunction. 30 tablet 1   Testosterone 1.62 % GEL Place 3 Pumps total onto the skin (upper ourter arm) every morning. 150 g 3   irbesartan (AVAPRO) 75 MG tablet Take 75 mg by mouth daily.     No current facility-administered medications for this visit.     Past Medical History:  Diagnosis Date   Anxiety    DVT (deep vein thrombosis) in pregnancy    Gastroesophageal reflux disease    Hypertension    Lupus anticoagulant positive     Past Surgical History:  Procedure Laterality Date   Ankle fracture repair     VASECTOMY      Social History   Socioeconomic History   Marital status: Married    Spouse name: Not on file   Number of children: 5   Years of education: Not on file   Highest education level: Not on file  Occupational History   Not on file  Tobacco Use   Smoking status: Former    Current packs/day: 0.25    Average packs/day: 0.3 packs/day for 1 year (0.3 ttl pk-yrs)    Types: Cigarettes     Passive exposure: Never   Smokeless tobacco: Never  Vaping Use   Vaping status: Never Used  Substance and Sexual Activity   Alcohol use: Not Currently    Alcohol/week: 28.0 standard drinks of alcohol    Types: 28 Cans of beer per week    Comment: 4 beers per day   Drug use: Never   Sexual activity: Yes  Other Topics Concern   Not on file  Social History Narrative   Not on file   Social Drivers of Health   Financial Resource Strain: Not on file  Food Insecurity: Not on file  Transportation Needs: No Transportation Needs (11/27/2022)   PRAPARE - Transportation    Lack of Transportation (Medical): No    Lack of Transportation (Non-Medical): No  Physical Activity: Not on file  Stress: Not on file  Social Connections: Unknown (09/01/2021)   Received from [facility], [facility]   Social Network    Social Network: Not on file  Intimate Partner Violence: Unknown (07/24/2021)   Received from [facility], [facility]   HITS    Physically Hurt: Not on file    Insult or Talk Down To: Not on file    Threaten Physical Harm: Not on file    Scream or Curse:  Not on file    Family History  Problem Relation Age of Onset   Hypertension Mother    Throat cancer Father    Stroke Sister    Heart attack Maternal Grandfather    Heart attack Paternal Grandfather     ROS: no fevers or chills, productive cough, hemoptysis, dysphasia, odynophagia, melena, hematochezia, dysuria, hematuria, rash, seizure activity, orthopnea, PND, pedal edema, claudication. Remaining systems are negative.  Physical Exam: Well-developed well-nourished in no acute distress.  Skin is warm and dry.  HEENT is normal.  Neck is supple.  Chest is clear to auscultation with normal expansion.  Cardiovascular exam is regular rate and rhythm.  Abdominal exam nontender or distended. No masses palpated. Extremities show no edema. neuro grossly intact   A/P  1 previous episode of syncope-this was felt to be  likely orthostatic mediated.  I did order an echocardiogram at previous office visit but not performed.  I discussed reordering today and he declined.  2 coronary calcification-no chest pain.  Continue statin.  3 hypertension-blood pressure elevated; increase Avapro to 150 mg daily and adjust based on follow-up readings.  4 palpitations-symptoms are reasonly well-controlled.  Continue beta-blocker at present dose.  Note previous strips from Apple Watch showed PVCs.  Olga Millers, MD

## 2023-05-22 ENCOUNTER — Encounter: Payer: Self-pay | Admitting: Family Medicine

## 2023-05-23 ENCOUNTER — Ambulatory Visit: Payer: 59 | Admitting: Family Medicine

## 2023-05-27 ENCOUNTER — Ambulatory Visit (INDEPENDENT_AMBULATORY_CARE_PROVIDER_SITE_OTHER): Payer: 59 | Admitting: Cardiology

## 2023-05-27 ENCOUNTER — Encounter: Payer: Self-pay | Admitting: Cardiology

## 2023-05-27 VITALS — BP 150/81 | HR 68 | Ht 68.0 in | Wt 234.0 lb

## 2023-05-27 DIAGNOSIS — I2510 Atherosclerotic heart disease of native coronary artery without angina pectoris: Secondary | ICD-10-CM | POA: Diagnosis not present

## 2023-05-27 DIAGNOSIS — R55 Syncope and collapse: Secondary | ICD-10-CM | POA: Diagnosis not present

## 2023-05-27 DIAGNOSIS — I10 Essential (primary) hypertension: Secondary | ICD-10-CM | POA: Diagnosis not present

## 2023-05-27 DIAGNOSIS — R002 Palpitations: Secondary | ICD-10-CM

## 2023-05-27 MED ORDER — IRBESARTAN 150 MG PO TABS: 150.0000 mg | ORAL_TABLET | Freq: Every day | ORAL | 3 refills | Status: DC

## 2023-05-27 NOTE — Patient Instructions (Signed)
Medication Instructions:   INCREASE IRBESARTAN TO 150 MG ONCE DAILY= 2 OF THE 75 MG TABLETS ONCE DAILY  *If you need a refill on your cardiac medications before your next appointment, please call your pharmacy*   Follow-Up: At [facility], you and your health needs are our priority.  As part of our continuing mission to provide you with exceptional heart care, we have created designated Provider Care Teams.  These Care Teams include your primary Cardiologist (physician) and Advanced Practice Providers (APPs -  Physician Assistants and Nurse Practitioners) who all work together to provide you with the care you need, when you need it.   Your next appointment:   12 month(s)  Provider:   Olga Millers, MD

## 2023-05-28 MED ORDER — IRBESARTAN 75 MG PO TABS: 75.0000 mg | ORAL_TABLET | Freq: Every day | ORAL | Status: DC

## 2023-05-31 ENCOUNTER — Other Ambulatory Visit: Payer: Self-pay | Admitting: Family Medicine

## 2023-05-31 DIAGNOSIS — E291 Testicular hypofunction: Secondary | ICD-10-CM

## 2023-06-12 ENCOUNTER — Encounter: Payer: Self-pay | Admitting: Family Medicine

## 2023-06-12 ENCOUNTER — Ambulatory Visit: Payer: 59 | Admitting: Family Medicine

## 2023-06-12 VITALS — BP 120/71 | HR 67 | Ht 68.0 in | Wt 232.8 lb

## 2023-06-12 DIAGNOSIS — L853 Xerosis cutis: Secondary | ICD-10-CM

## 2023-06-12 DIAGNOSIS — F411 Generalized anxiety disorder: Secondary | ICD-10-CM

## 2023-06-12 DIAGNOSIS — R5383 Other fatigue: Secondary | ICD-10-CM

## 2023-06-12 DIAGNOSIS — M1A00X0 Idiopathic chronic gout, unspecified site, without tophus (tophi): Secondary | ICD-10-CM | POA: Diagnosis not present

## 2023-06-12 DIAGNOSIS — I10 Essential (primary) hypertension: Secondary | ICD-10-CM | POA: Diagnosis not present

## 2023-06-12 DIAGNOSIS — N529 Male erectile dysfunction, unspecified: Secondary | ICD-10-CM

## 2023-06-12 NOTE — Assessment & Plan Note (Signed)
 Recheck uric acid level today he is not currently on prophylaxis.

## 2023-06-12 NOTE — Progress Notes (Signed)
 Established Patient Office Visit  Subjective  Patient ID: Derek Pollard, male    DOB: 11/17/1960  Age: 63 y.o. MRN: [id number]  Chief Complaint  Patient presents with   Hypertension    6 mth HTN follow up visit    HPI  Restarted the irbesartan with Dr. Olga Millers but says ever since he started it he is just felt more tired but no dizziness or lightheadedness.  But his wife is also been dealing with a new diagnosis of breast cancer as well so he knows that is been stressful so he is just not sure how much of it may be that or the medication.  Tolerating the Lexapro well without any side effects or problems he feels like he is happy with his current dosing regimen.  He did recently see Dr. Olga Millers on February 3 for palpitations and elevated blood pressure.  They did go ahead and restart his a irbesartan for elevated blood pressure.  Ports that his skin has been excessively dry this winter he will get little white patches and it just feels extremely itchy.  He has been trying to use a moisturizer.      06/12/2023    9:04 AM 10/30/2022   11:37 AM 04/10/2022    9:45 AM 12/22/2020   10:57 AM  Depression screen PHQ 2/9  Decreased Interest 0 0 0 1  Down, Depressed, Hopeless 0 0 0 0  PHQ - 2 Score 0 0 0 1  Altered sleeping    3  Tired, decreased energy    2  Change in appetite    0  Feeling bad or failure about yourself     0  Trouble concentrating    0  Moving slowly or fidgety/restless    0  Suicidal thoughts    0  PHQ-9 Score    6  Difficult doing work/chores    Somewhat difficult      12/22/2020   10:57 AM  GAD 7 : Generalized Anxiety Score  Nervous, Anxious, on Edge 0  Control/stop worrying 0  Worry too much - different things 1  Trouble relaxing 1  Restless 2  Easily annoyed or irritable 1  Afraid - awful might happen 0  Total GAD 7 Score 5  Anxiety Difficulty Not difficult at all       ROS    Objective:     BP 120/71   Pulse 67   Ht 5\' 8"   (1.727 m)   Wt 232 lb 12 oz (105.6 kg)   SpO2 98%   BMI 35.39 kg/m    Physical Exam Vitals and nursing note reviewed.  Constitutional:      Appearance: Normal appearance.  HENT:     Head: Normocephalic and atraumatic.  Eyes:     Conjunctiva/sclera: Conjunctivae normal.  Cardiovascular:     Rate and Rhythm: Normal rate and regular rhythm.  Pulmonary:     Effort: Pulmonary effort is normal.     Breath sounds: Normal breath sounds.  Skin:    General: Skin is warm and dry.  Neurological:     Mental Status: He is alert.  Psychiatric:        Mood and Affect: Mood normal.     No results found for any visits on 06/12/23.    The 10-year ASCVD risk score (Arnett DK, et al., 2019) is: 5%    Assessment & Plan:   Problem List Items Addressed This Visit       Cardiovascular  and Mediastinum   Essential hypertension - Primary   Well controlled. Continue current regimen. Follow up in  78mo Ha been feeling fatigued since starting the medication. Plan to let me know in a couple of weeks if not improving and we can change the medication.        Relevant Orders   CBC with Differential/Platelet   CMP14+EGFR   TSH   Uric acid     Other   Gout   Recheck uric acid level today he is not currently on prophylaxis.      Relevant Orders   CBC with Differential/Platelet   CMP14+EGFR   TSH   Uric acid   GAD (generalized anxiety disorder)   He is happy with his current regimen with Lexapro continue current medication dosing regimen.  He says when he starts to feel more anxious he usually has some strategies that he can employ to really help get his mind settled back down which is fantastic.      Erectile dysfunction   On Cialis as needed.  Currently prescribed by Dr. Jens Som.      Other Visit Diagnoses       Dry skin       Relevant Orders   CBC with Differential/Platelet   CMP14+EGFR   TSH   Uric acid     Other fatigue           Skin-most consistent with winter  itch-we discussed using a good moisturizing soap in the shower avoiding hot showers which can strip natural oils and moisture from the skin.  Patting dry and then immediately applying a moisturizer to seal in the skin.  And then reapplying during the day if needed.   Fatigue -would be secondary to blood pressure medication but may also be related to recent stressors so we discussed maybe continuing with the current med for another 3 or 4 weeks just to see but we could always switch to a different ARB if that 1 is particularly causing some fatigue issues. Return in about 6 months (around 12/10/2023) for Hypertension.    [name], MD

## 2023-06-12 NOTE — Assessment & Plan Note (Signed)
 Well controlled. Continue current regimen. Follow up in  34mo Ha been feeling fatigued since starting the medication. Plan to let me know in a couple of weeks if not improving and we can change the medication.

## 2023-06-12 NOTE — Assessment & Plan Note (Signed)
 On Cialis as needed.  Currently prescribed by Dr. Jens Som.

## 2023-06-12 NOTE — Assessment & Plan Note (Signed)
 He is happy with his current regimen with Lexapro continue current medication dosing regimen.  He says when he starts to feel more anxious he usually has some strategies that he can employ to really help get his mind settled back down which is fantastic.

## 2023-06-13 ENCOUNTER — Encounter: Payer: Self-pay | Admitting: Family Medicine

## 2023-06-13 ENCOUNTER — Ambulatory Visit: Payer: 59 | Admitting: Family Medicine

## 2023-06-13 DIAGNOSIS — M109 Gout, unspecified: Secondary | ICD-10-CM

## 2023-06-13 LAB — CMP14+EGFR
ALT: 35 [IU]/L (ref 0–44)
AST: 29 [IU]/L (ref 0–40)
Albumin: 4.2 g/dL (ref 3.8–4.9)
Alkaline Phosphatase: 74 [IU]/L (ref 44–121)
BUN/Creatinine Ratio: 20 (ref 10–24)
BUN: 20 mg/dL (ref 8–27)
Bilirubin Total: 0.8 mg/dL (ref 0.0–1.2)
CO2: 23 mmol/L (ref 20–29)
Calcium: 9.1 mg/dL (ref 8.6–10.2)
Chloride: 103 mmol/L (ref 96–106)
Creatinine, Ser: 1.01 mg/dL (ref 0.76–1.27)
Globulin, Total: 2.1 g/dL (ref 1.5–4.5)
Glucose: 108 mg/dL — ABNORMAL HIGH (ref 70–99)
Potassium: 5 mmol/L (ref 3.5–5.2)
Sodium: 138 mmol/L (ref 134–144)
Total Protein: 6.3 g/dL (ref 6.0–8.5)
eGFR: 85 mL/min/{1.73_m2} (ref >59)

## 2023-06-13 LAB — URIC ACID: Uric Acid: 9.2 mg/dL — ABNORMAL HIGH (ref 3.8–8.4)

## 2023-06-13 LAB — CBC WITH DIFFERENTIAL/PLATELET
Basophils Absolute: 0.1 10*3/uL (ref 0.0–0.2)
Basos: 1 %
EOS (ABSOLUTE): 0.4 10*3/uL (ref 0.0–0.4)
Eos: 7 %
Hematocrit: 47.4 % (ref 37.5–51.0)
Hemoglobin: 15.8 g/dL (ref 13.0–17.7)
Immature Grans (Abs): 0 10*3/uL (ref 0.0–0.1)
Immature Granulocytes: 0 %
Lymphocytes Absolute: 1.5 10*3/uL (ref 0.7–3.1)
Lymphs: 26 %
MCH: 30.7 pg (ref 26.6–33.0)
MCHC: 33.3 g/dL (ref 31.5–35.7)
MCV: 92 fL (ref 79–97)
Monocytes Absolute: 0.5 10*3/uL (ref 0.1–0.9)
Monocytes: 10 %
Neutrophils Absolute: 3.3 10*3/uL (ref 1.4–7.0)
Neutrophils: 56 %
Platelets: 178 10*3/uL (ref 150–450)
RBC: 5.15 x10E6/uL (ref 4.14–5.80)
RDW: 11.8 % (ref 11.6–15.4)
WBC: 5.7 10*3/uL (ref 3.4–10.8)

## 2023-06-13 LAB — TSH: TSH: 1.47 u[IU]/mL (ref 0.450–4.500)

## 2023-06-13 NOTE — Progress Notes (Signed)
 Hi Derek Pollard, your uric acid went way way up.  We should really consider putting you on allopurinol to help reduce your uric acid level.  Let me know if you are okay with starting that.  Working on a low purine diet will help as well.  Metabolic panel looks good.  Blood count and thyroid are normal.

## 2023-06-17 MED ORDER — ALLOPURINOL 100 MG PO TABS: 100.0000 mg | ORAL_TABLET | Freq: Every day | ORAL | 3 refills | Status: DC

## 2023-06-17 MED ORDER — COLCHICINE 0.6 MG PO TABS: 0.6000 mg | ORAL_TABLET | Freq: Every day | ORAL | 1 refills | Status: DC

## 2023-06-18 ENCOUNTER — Other Ambulatory Visit: Payer: Self-pay | Admitting: Cardiology

## 2023-06-26 ENCOUNTER — Encounter: Payer: Self-pay | Admitting: Family Medicine

## 2023-08-06 ENCOUNTER — Other Ambulatory Visit: Payer: Self-pay

## 2023-08-06 MED ORDER — IRBESARTAN 75 MG PO TABS: 75.0000 mg | ORAL_TABLET | Freq: Every day | ORAL | 3 refills | Status: AC

## 2023-08-10 ENCOUNTER — Other Ambulatory Visit: Payer: Self-pay | Admitting: Family Medicine

## 2023-08-10 DIAGNOSIS — E291 Testicular hypofunction: Secondary | ICD-10-CM

## 2023-08-13 ENCOUNTER — Encounter: Payer: Self-pay | Admitting: Family Medicine

## 2023-08-13 DIAGNOSIS — E291 Testicular hypofunction: Secondary | ICD-10-CM

## 2023-08-13 DIAGNOSIS — M109 Gout, unspecified: Secondary | ICD-10-CM

## 2023-08-13 MED ORDER — TESTOSTERONE 20.25 MG/ACT (1.62%) TD GEL: TRANSDERMAL | 0 refills | Status: DC

## 2023-08-13 NOTE — Telephone Encounter (Signed)
 Reqeusting rx rf of  testosterone  gel Last written 06/10/2023 Last OV 06/12/2023 Upcoming appt 12/10/2023 And  Allopurinol  100mg   But this should still have refills available at pharmacy. Sent patient a Mychart message with this information.

## 2023-08-13 NOTE — Telephone Encounter (Signed)
 Duplicate request. Thanks in advance.

## 2023-08-14 MED ORDER — TESTOSTERONE 20.25 MG/ACT (1.62%) TD GEL: TRANSDERMAL | 0 refills | Status: DC

## 2023-08-14 NOTE — Addendum Note (Signed)
 Addended by: Debbi Strandberg D on: 08/14/2023 03:12 PM   Modules accepted: Orders

## 2023-08-22 ENCOUNTER — Other Ambulatory Visit: Payer: Self-pay

## 2023-08-22 ENCOUNTER — Encounter: Payer: Self-pay | Admitting: Family Medicine

## 2023-08-22 ENCOUNTER — Ambulatory Visit: Admitting: Family Medicine

## 2023-08-22 VITALS — BP 126/72 | HR 67 | Ht 68.0 in | Wt 225.0 lb

## 2023-08-22 DIAGNOSIS — M1A00X0 Idiopathic chronic gout, unspecified site, without tophus (tophi): Secondary | ICD-10-CM

## 2023-08-22 DIAGNOSIS — I10 Essential (primary) hypertension: Secondary | ICD-10-CM | POA: Diagnosis not present

## 2023-08-22 DIAGNOSIS — I2510 Atherosclerotic heart disease of native coronary artery without angina pectoris: Secondary | ICD-10-CM

## 2023-08-22 DIAGNOSIS — E785 Hyperlipidemia, unspecified: Secondary | ICD-10-CM | POA: Diagnosis not present

## 2023-08-22 DIAGNOSIS — R931 Abnormal findings on diagnostic imaging of heart and coronary circulation: Secondary | ICD-10-CM | POA: Insufficient documentation

## 2023-08-22 DIAGNOSIS — F411 Generalized anxiety disorder: Secondary | ICD-10-CM | POA: Diagnosis not present

## 2023-08-22 DIAGNOSIS — M109 Gout, unspecified: Secondary | ICD-10-CM

## 2023-08-22 MED ORDER — ALLOPURINOL 100 MG PO TABS: 100.0000 mg | ORAL_TABLET | Freq: Every day | ORAL | 1 refills | Status: DC

## 2023-08-22 NOTE — Assessment & Plan Note (Addendum)
Well controlled. Continue current regimen. Follow up in  6 mo . Labs are up to date.  

## 2023-08-22 NOTE — Progress Notes (Signed)
   Established Patient Office Visit  Subjective  Patient ID: Derek Pollard, male    DOB: Oct 11, 1960  Age: 63 y.o. MRN: [id number]  Chief Complaint  Patient presents with   Hypertension   Gout   Hyperlipidemia   Anxiety    HPI  Hypertension- Pt denies chest pain, SOB, dizziness, or heart palpitations.  Taking meds as directed w/o problems.  Denies medication side effects.  There has been confusion about his medication with the mail order.    F/U GOUT - no recent flares.  He is back on allopurinol .    Has been under a lot of stress with his wife battling cancer. He is working with his therapist.     ROS    Objective:     BP 126/72   Pulse 67   Ht 5\' 8"  (1.727 m)   Wt 225 lb (102.1 kg)   SpO2 100%   BMI 34.21 kg/m    Physical Exam Vitals and nursing note reviewed.  Constitutional:      Appearance: Normal appearance.  HENT:     Head: Normocephalic and atraumatic.  Eyes:     Conjunctiva/sclera: Conjunctivae normal.  Cardiovascular:     Rate and Rhythm: Normal rate and regular rhythm.  Pulmonary:     Effort: Pulmonary effort is normal.     Breath sounds: Normal breath sounds.  Skin:    General: Skin is warm and dry.  Neurological:     Mental Status: He is alert.  Psychiatric:        Mood and Affect: Mood normal.      No results found for any visits on 08/22/23.    The 10-year ASCVD risk score (Arnett DK, et al., 2019) is: 5.4%    Assessment & Plan:   Problem List Items Addressed This Visit       Cardiovascular and Mediastinum   Essential hypertension - Primary   Well controlled. Continue current regimen. Follow up in  6 mo. Labs are up to date.       Relevant Medications   rosuvastatin  (CRESTOR ) 40 MG tablet   Elevated coronary artery calcium  score   02/2021: Calcium  score 80th percentile Strongly encouraged him to restart his statin.  Says he still has a bottle at home so did not send over refill today.      Relevant Medications    rosuvastatin  (CRESTOR ) 40 MG tablet     Other   Hyperlipidemia   Due to recheck lipids once back on crestor .        Relevant Medications   rosuvastatin  (CRESTOR ) 40 MG tablet   Gout   Last uric acid was high, he is working on weight loss and low purine diet.        GAD (generalized anxiety disorder)   Continue Lexapro .  He has had a lot of increased stress as his wife has been battling breast cancer it has increased his anxiety.  She is actually getting ready to finish her radiation treatments and has actually been doing really well.  He does work with his therapist.      Other Visit Diagnoses       Coronary artery disease involving native coronary artery of native heart without angina pectoris       Relevant Medications   rosuvastatin  (CRESTOR ) 40 MG tablet       Return in about 6 months (around 02/22/2024) for Hypertension.    Duaine German, MD

## 2023-08-22 NOTE — Assessment & Plan Note (Signed)
 02/2021: Calcium  score 80th percentile Strongly encouraged him to restart his statin.  Says he still has a bottle at home so did not send over refill today.

## 2023-08-22 NOTE — Assessment & Plan Note (Signed)
 Due to recheck lipids once back on crestor .

## 2023-08-22 NOTE — Patient Instructions (Signed)
 Please start taking Crestor  nightly again. Thank you.

## 2023-08-22 NOTE — Assessment & Plan Note (Signed)
 Last uric acid was high, he is working on weight loss and low purine diet.

## 2023-08-22 NOTE — Assessment & Plan Note (Signed)
 Continue Lexapro .  He has had a lot of increased stress as his wife has been battling breast cancer it has increased his anxiety.  She is actually getting ready to finish her radiation treatments and has actually been doing really well.  He does work with his therapist.

## 2023-08-29 ENCOUNTER — Telehealth: Payer: Self-pay

## 2023-08-29 NOTE — Telephone Encounter (Signed)
 Pharmacy Patient Advocate Encounter  Received notification from [facility] that Prior Authorization for Testosterone  20.25 MG/ACT(1.62%) gel has been APPROVED from 08/29/23 to 08/29/26   PA #/Case ID/Reference #:  [id number]

## 2023-08-29 NOTE — Telephone Encounter (Signed)
 Pharmacy Patient Advocate Encounter   Received notification from CoverMyMeds that prior authorization for Testosterone  20.25 MG/ACT(1.62%) gel is required/requested.   Insurance verification completed.   The patient is insured through [facility] .   Per test claim: PA required; PA submitted to above mentioned insurance via CoverMyMeds Key/confirmation #/EOC [id number] Status is pending

## 2023-08-30 DIAGNOSIS — E291 Testicular hypofunction: Secondary | ICD-10-CM

## 2023-08-30 MED ORDER — TESTOSTERONE 10 MG/ACT (2%) TD GEL: 1.0000 | Freq: Every morning | TRANSDERMAL | 1 refills | Status: DC

## 2023-08-30 NOTE — Telephone Encounter (Signed)
 Will send new prescription for Fortesta  which can be applied on the inner thigh and it is a smaller quantity with each pump.   Meds ordered this encounter  Medications   Testosterone  10 MG/ACT (2%) GEL    Sig: Place 1 Pump onto the skin in the morning. To each inner thigh    Dispense:  60 g    Refill:  1

## 2023-09-01 ENCOUNTER — Other Ambulatory Visit: Payer: Self-pay | Admitting: Family Medicine

## 2023-09-01 DIAGNOSIS — M109 Gout, unspecified: Secondary | ICD-10-CM

## 2023-09-02 ENCOUNTER — Other Ambulatory Visit: Payer: Self-pay | Admitting: *Deleted

## 2023-09-02 ENCOUNTER — Telehealth: Payer: Self-pay

## 2023-09-02 DIAGNOSIS — E291 Testicular hypofunction: Secondary | ICD-10-CM

## 2023-09-02 MED ORDER — ROSUVASTATIN CALCIUM 40 MG PO TABS: 40.0000 mg | ORAL_TABLET | Freq: Every day | ORAL | 3 refills | Status: AC

## 2023-09-02 NOTE — Telephone Encounter (Signed)
 Copied from CRM [id number]. Topic: Clinical - Prescription Issue >> Sep 02, 2023 10:13 AM Jyl Or H wrote: Reason for CRM: Derek Pollard with [facility] pharmacy called to advise that Testosterone  10 MG/ACT (2%) GEL is not available at that location.He would like to know if patient can be prescribed alternative. Please assist and advise.

## 2023-09-02 NOTE — Telephone Encounter (Signed)
 No, lets see if we can change pharmacy.

## 2023-09-02 NOTE — Telephone Encounter (Signed)
 Attempted to contact the patient. No answer. Left a detailed vm msg requesting a call back to the clinic. Direct call back info provided.

## 2023-09-04 MED ORDER — TESTOSTERONE 10 MG/ACT (2%) TD GEL
1.0000 | Freq: Every morning | TRANSDERMAL | 1 refills | Status: DC
Start: 2023-09-04 — End: 2023-12-24

## 2023-09-05 ENCOUNTER — Other Ambulatory Visit: Payer: Self-pay | Admitting: Cardiology

## 2023-09-05 NOTE — Telephone Encounter (Signed)
 Dr. Lourdes Roy pt. As this is not a Cardiac RX does Dr. Audery Blazing want to refill? Please advise.

## 2023-09-12 ENCOUNTER — Ambulatory Visit: Admitting: Family Medicine

## 2023-10-08 ENCOUNTER — Encounter: Payer: Self-pay | Admitting: Family Medicine

## 2023-10-08 DIAGNOSIS — M109 Gout, unspecified: Secondary | ICD-10-CM

## 2023-10-08 NOTE — Telephone Encounter (Signed)
 Spoke with [facility] Patient received th Allopurinol  100mg  in mail box on 08/26/2023 as 90 day supply - delivery confirmed and showing one refill remaining.   She did note that also on 08/22/2023 had received a refill for Allopurinol  300mg   but this was cancelled as having expired.  Attempted call to patient. Voice mail box full could not leave a voice mail message.

## 2023-10-17 ENCOUNTER — Other Ambulatory Visit: Payer: Self-pay | Admitting: Family Medicine

## 2023-11-05 ENCOUNTER — Other Ambulatory Visit: Payer: Self-pay | Admitting: Cardiology

## 2023-11-05 NOTE — Telephone Encounter (Signed)
 Pt's pharmacy is requesting a refill on non medication tadalafil . Would Dr. Pietro like to refill this non cardiac medication? Please address

## 2023-11-06 ENCOUNTER — Encounter: Payer: Self-pay | Admitting: Family Medicine

## 2023-11-08 NOTE — Telephone Encounter (Signed)
 Yes there are other options and I am happy to discuss those.  But will need an appointment we need to pinpoint what symptoms were treating specifically so I do not want to just make a recommendation over MyChart.  I need a lot more detail for me to make a better decision for him for changing his meds.

## 2023-11-08 NOTE — Telephone Encounter (Signed)
 Patient scheduled  for 12/03/2023 @ 9:30 am -  added to waitlist for DR. Metheney for earlier appt.

## 2023-11-18 ENCOUNTER — Ambulatory Visit: Admitting: Family Medicine

## 2023-11-18 ENCOUNTER — Encounter: Payer: Self-pay | Admitting: Family Medicine

## 2023-11-18 VITALS — BP 117/60 | HR 73 | Ht 68.0 in | Wt 223.3 lb

## 2023-11-18 DIAGNOSIS — E291 Testicular hypofunction: Secondary | ICD-10-CM

## 2023-11-18 DIAGNOSIS — I10 Essential (primary) hypertension: Secondary | ICD-10-CM

## 2023-11-18 DIAGNOSIS — F411 Generalized anxiety disorder: Secondary | ICD-10-CM

## 2023-11-18 MED ORDER — SERTRALINE HCL 50 MG PO TABS: ORAL_TABLET | ORAL | 1 refills | Status: DC

## 2023-11-18 MED ORDER — HYDROXYZINE PAMOATE 25 MG PO CAPS: 25.0000 mg | ORAL_CAPSULE | Freq: Three times a day (TID) | ORAL | 0 refills | Status: AC | PRN

## 2023-11-18 NOTE — Assessment & Plan Note (Signed)
 BP at goal

## 2023-11-18 NOTE — Assessment & Plan Note (Signed)
 We discussed options.  Will taper off the Lexapro  and switch to sertraline .  Continue to work on good sleep hygiene additional information provided.  Also provided hydroxyzine  to use as needed for panic and anxiety.  Follow-up in 6 weeks.

## 2023-11-18 NOTE — Progress Notes (Signed)
 Established Patient Office Visit  Subjective  Patient ID: Derek Pollard, male    DOB: 1960/10/12  Age: 63 y.o. MRN: [id number]  Chief Complaint  Patient presents with   General anxiety disorder    Patient request possible change from Lexapro  - states he feels it is no longer working - increased anxiety     HPI  Follow-up generalized anxiety disorder-when I last saw him in May he did discuss that he was struggling a little bit as his wife Olam was battling cancer.  He was working with his therapist at the time and is taking Lexapro  20 mg daily.  His wife is actually doing really well but he just sort of feels like he has hit a wall with the Lexapro .  Has been working with a therapist for a little over a year now he is more recently been going every 2 weeks but in some ways he feels like that is really opened up some of his emotions he has days where he will feel anxious all day long.  Work is stressful but not a stressful as it was a couple months ago so that is been good he would like to have something as needed also for feeling overly anxious and panicky.  He is not sleeping well he is only getting about 4 to 6 hours per night.  He occasionally gets some palpitations and chest discomfort.  He has a history of PVCs.     ROS    Objective:     BP 117/60   Pulse 73   Ht 5' 8 (1.727 m)   Wt 223 lb 5 oz (101.3 kg)   SpO2 97%   BMI 33.95 kg/m    Physical Exam Vitals and nursing note reviewed.  Constitutional:      Appearance: Normal appearance.  HENT:     Head: Normocephalic and atraumatic.  Eyes:     Conjunctiva/sclera: Conjunctivae normal.  Cardiovascular:     Rate and Rhythm: Normal rate and regular rhythm.  Pulmonary:     Effort: Pulmonary effort is normal.     Breath sounds: Normal breath sounds.  Skin:    General: Skin is warm and dry.  Neurological:     Mental Status: He is alert.  Psychiatric:        Mood and Affect: Mood normal.      No results found  for any visits on 11/18/23.    The 10-year ASCVD risk score (Arnett DK, et al., 2019) is: 4.7%    Assessment & Plan:   Problem List Items Addressed This Visit       Cardiovascular and Mediastinum   Essential hypertension   BP at goal.        Relevant Orders   CMP14+EGFR   Lipid panel   CBC   Testosterone , Free, Total, SHBG     Endocrine   Hypogonadism in male   On testosterone  replacement with 2% gel.  Will get updated labs drawn today.      Relevant Orders   CMP14+EGFR   Lipid panel   CBC   Testosterone , Free, Total, SHBG     Other   GAD (generalized anxiety disorder) - Primary   We discussed options.  Will taper off the Lexapro  and switch to sertraline .  Continue to work on good sleep hygiene additional information provided.  Also provided hydroxyzine  to use as needed for panic and anxiety.  Follow-up in 6 weeks.      Relevant Medications  sertraline  (ZOLOFT ) 50 MG tablet   hydrOXYzine  (VISTARIL ) 25 MG capsule   Other Relevant Orders   CMP14+EGFR   Lipid panel   CBC   Testosterone , Free, Total, SHBG    Return in about 6 weeks (around 12/30/2023) for New start medication.   I spent 25 minutes on the day of the encounter to include pre-visit record review, face-to-face time with the patient and post visit ordering of test.   Dorothyann Byars, MD

## 2023-11-18 NOTE — Patient Instructions (Signed)
 Cut Lexapro  in half.  Take half a tab daily for 6 days and then a half tab every other day for 6 days.  Then okay to start new medication called sertraline .  Follow instructions on the bottle.

## 2023-11-18 NOTE — Assessment & Plan Note (Signed)
 On testosterone  replacement with 2% gel.  Will get updated labs drawn today.

## 2023-11-19 ENCOUNTER — Ambulatory Visit: Payer: Self-pay | Admitting: Family Medicine

## 2023-11-19 LAB — TESTOSTERONE, FREE, TOTAL, SHBG
Sex Hormone Binding: 34.4 nmol/L (ref 19.3–76.4)
Testosterone, Free: 9.9 pg/mL (ref 6.6–18.1)
Testosterone: 480 ng/dL (ref 264–916)

## 2023-11-19 LAB — CBC
Hematocrit: 46.9 % (ref 37.5–51.0)
Hemoglobin: 15.2 g/dL (ref 13.0–17.7)
MCH: 30.8 pg (ref 26.6–33.0)
MCHC: 32.4 g/dL (ref 31.5–35.7)
MCV: 95 fL (ref 79–97)
Platelets: 171 x10E3/uL (ref 150–450)
RBC: 4.94 x10E6/uL (ref 4.14–5.80)
RDW: 11.4 % — ABNORMAL LOW (ref 11.6–15.4)
WBC: 6.1 x10E3/uL (ref 3.4–10.8)

## 2023-11-19 LAB — CMP14+EGFR
ALT: 42 IU/L (ref 0–44)
AST: 35 IU/L (ref 0–40)
Albumin: 4 g/dL (ref 3.8–4.9)
Alkaline Phosphatase: 94 IU/L (ref 44–121)
BUN/Creatinine Ratio: 16 (ref 10–24)
BUN: 14 mg/dL (ref 8–27)
Bilirubin Total: 0.6 mg/dL (ref 0.0–1.2)
CO2: 21 mmol/L (ref 20–29)
Calcium: 9.2 mg/dL (ref 8.6–10.2)
Chloride: 105 mmol/L (ref 96–106)
Creatinine, Ser: 0.9 mg/dL (ref 0.76–1.27)
Globulin, Total: 2.2 g/dL (ref 1.5–4.5)
Glucose: 116 mg/dL — ABNORMAL HIGH (ref 70–99)
Potassium: 4 mmol/L (ref 3.5–5.2)
Sodium: 141 mmol/L (ref 134–144)
Total Protein: 6.2 g/dL (ref 6.0–8.5)
eGFR: 98 mL/min/1.73 (ref >59)

## 2023-11-19 LAB — LIPID PANEL
Chol/HDL Ratio: 1.6 ratio (ref 0.0–5.0)
Cholesterol, Total: 116 mg/dL (ref 100–199)
HDL: 72 mg/dL (ref >39)
LDL Chol Calc (NIH): 29 mg/dL (ref 0–99)
Triglycerides: 72 mg/dL (ref 0–149)
VLDL Cholesterol Cal: 15 mg/dL (ref 5–40)

## 2023-11-19 NOTE — Progress Notes (Signed)
 Hi Berk, metabolic panel overall is okay.  Glucose was up a little bit but I think you were not fasting.  Blood counts normal no sign of anemia.  LDL cholesterol looks fantastic.  And total testosterone  looks good but the free testosterone  still pending.

## 2023-11-19 NOTE — Progress Notes (Signed)
 Free testosterone  looks better it is now 9.9 up from 2.2.  So the levels are now adequate.

## 2023-12-03 ENCOUNTER — Ambulatory Visit: Admitting: Family Medicine

## 2023-12-10 ENCOUNTER — Ambulatory Visit: Payer: 59 | Admitting: Family Medicine

## 2023-12-19 ENCOUNTER — Encounter: Payer: Self-pay | Admitting: Family Medicine

## 2023-12-19 ENCOUNTER — Ambulatory Visit: Admitting: Family Medicine

## 2023-12-19 VITALS — BP 117/63 | HR 73 | Temp 98.7°F | Ht 68.0 in | Wt 217.0 lb

## 2023-12-19 DIAGNOSIS — I10 Essential (primary) hypertension: Secondary | ICD-10-CM | POA: Diagnosis not present

## 2023-12-19 DIAGNOSIS — R7309 Other abnormal glucose: Secondary | ICD-10-CM

## 2023-12-19 DIAGNOSIS — J069 Acute upper respiratory infection, unspecified: Secondary | ICD-10-CM

## 2023-12-19 LAB — POCT GLYCOSYLATED HEMOGLOBIN (HGB A1C): Hemoglobin A1C: 5.2 % (ref 4.0–5.6)

## 2023-12-19 NOTE — Progress Notes (Signed)
 Acute Office Visit  Subjective:     Patient ID: Derek Pollard, male    DOB: Oct 13, 1960, 63 y.o.   MRN: [id number]  Chief Complaint  Patient presents with   Sore Throat   Sinusitis    HPI   He would also like to f/u for his GAD.  He had appt next week.   Discussed the use of AI scribe software for clinical note transcription with the patient, who gave verbal consent to proceed.  History of Present Illness Derek Pollard is a 63 year old male who presents with a persistent cough and sore throat for the past week.  Upper respiratory symptoms - Persistent cough and sore throat for the past seven days, onset last Friday - Cough associated with significant sputum and mucus production - Sore throat described as 'super sore,' severe enough two days ago to make talking difficult - Intermittent earache with sensation of drainage in the ears - No fever throughout the illness - No stomach upset, diarrhea, or loose stools - Symptoms worsened until Wednesday night, then began to improve - No home COVID or flu tests performed  Sleep disturbance - Difficulty falling asleep due to a racing mind - Not using hydroxyzine  for sleep  Mood disorder medication management - Recently switched from Lexapro  to sertraline , currently taking 50 mg daily - On current dose for approximately 25 days, with four or five pills remaining from original 30-day supply - Refill not yet picked up  Hyperglycemia - Consistently elevated blood glucose levels - Decreased oral intake over the past five days due to illness    ROS      Objective:    BP 117/63   Pulse 73   Temp 98.7 F (37.1 C) (Oral)   Ht 5' 8 (1.727 m)   Wt 217 lb 0.6 oz (98.4 kg)   SpO2 97%   BMI 33.00 kg/m    Physical Exam Constitutional:      Appearance: Normal appearance.  HENT:     Head: Normocephalic and atraumatic.     Right Ear: Tympanic membrane, ear canal and external ear normal. There is no impacted cerumen.      Left Ear: Tympanic membrane, ear canal and external ear normal. There is no impacted cerumen.     Nose: Nose normal.     Mouth/Throat:     Pharynx: Oropharynx is clear.  Eyes:     Conjunctiva/sclera: Conjunctivae normal.  Cardiovascular:     Rate and Rhythm: Normal rate and regular rhythm.  Pulmonary:     Effort: Pulmonary effort is normal.     Breath sounds: Normal breath sounds.  Musculoskeletal:     Cervical back: Neck supple. No tenderness.  Lymphadenopathy:     Cervical: No cervical adenopathy.  Skin:    General: Skin is warm and dry.  Neurological:     Mental Status: He is alert and oriented to person, place, and time.  Psychiatric:        Mood and Affect: Mood normal.     Results for orders placed or performed in visit on 12/19/23  POCT HgB A1C  Result Value Ref Range   Hemoglobin A1C 5.2 4.0 - 5.6 %   HbA1c POC (<> result, manual entry)     HbA1c, POC (prediabetic range)     HbA1c, POC (controlled diabetic range)          Assessment & Plan:   Problem List Items Addressed This Visit       Cardiovascular  and Mediastinum   Essential hypertension   Blood pressure looks perfect today.      Other Visit Diagnoses       Upper respiratory tract infection, unspecified type    -  Primary     Abnormal glucose       Relevant Orders   POCT HgB A1C (Completed)       Assessment and Plan Assessment & Plan Acute upper respiratory infection with cough and sore throat x 7 days  Symptoms persisting for seven days with severe sore throat, cough with mucus, and earache with drainage. No fever. Improvement noted. Chest examination normal. - Continue current symptomatic treatment. - Send MyChart note if symptoms regress.  Insomnia Difficulty falling asleep due to racing thoughts. No issues staying asleep. - Try hydroxyzine  for sleep, take 30 minutes before bedtime. - Adjust sleep environment to be conducive to sleep.  Depressive disorder with medication  management On sertraline  50 mg daily, switched from Lexapro . Improvement noted but insomnia persists. Full efficacy expected in 6-8 weeks. Dose adjustment considered based on response to hydroxyzine . - Continue sertraline  50 mg daily.  Can consider increasing to either 75 or 100 mg if needed.  He should still have 1 more refill on the 50 mg dose. - Consider dose adjustment if insomnia persists after trying hydroxyzine .  Abnormal glucose  Potential impaired fasting glucose discussed. A1c test planned to assess glucose regulation. Explained A1c ranges and importance of early detection to prevent diabetes. - Perform A1c test with finger stick.  A1c in the normal range looks great today. - Discuss dietary changes to reduce carbohydrate and sugar intake. - Encourage regular exercise and weight loss.    No orders of the defined types were placed in this encounter.   Return in about 2 months (around 02/18/2024) for Mood .  Dorothyann Byars, MD

## 2023-12-19 NOTE — Assessment & Plan Note (Signed)
 Blood pressure looks perfect today.

## 2023-12-24 ENCOUNTER — Encounter: Payer: Self-pay | Admitting: Family Medicine

## 2023-12-24 ENCOUNTER — Encounter: Payer: Self-pay | Admitting: Sports Medicine

## 2023-12-24 ENCOUNTER — Telehealth: Payer: Self-pay | Admitting: Family Medicine

## 2023-12-24 MED ORDER — TESTOSTERONE 12.5 MG/ACT (1%) TD GEL: TRANSDERMAL | 1 refills | Status: AC

## 2023-12-24 MED ORDER — DOXYCYCLINE HYCLATE 100 MG PO TABS: 100.0000 mg | ORAL_TABLET | Freq: Two times a day (BID) | ORAL | 0 refills | Status: DC

## 2023-12-24 NOTE — Telephone Encounter (Signed)
 Please notify patient that the current testosterone  2% was on backorder so we have ordered AndroGel  pump instead through the mail order.  It says it is covered.  Fingers crossed.  Sent new prescription to [facility].  Meds ordered this encounter  Medications   Testosterone  12.5 MG/ACT (1%) GEL    Sig: 1 pump on 2 each upper outer arm every morning.  Total 2 pumps daily    Dispense:  225 g    Refill:  1

## 2023-12-25 ENCOUNTER — Telehealth: Payer: Self-pay

## 2023-12-25 NOTE — Telephone Encounter (Signed)
 New med was sent on 9/2. Did they not see the RX?  Please call to confirm

## 2023-12-25 NOTE — Telephone Encounter (Signed)
 Copied from CRM [id number]. Topic: Clinical - Prescription Issue >> Dec 25, 2023  9:05 AM Miquel SAILOR wrote: Reason for CRM: Testosterone  12.5 MG/ACT (1%) GEL-ref# [id number] Deanna calling from pharmacy due to mediation on back order if PCP would like alternative. Need call back  [phone] opt 2

## 2023-12-30 ENCOUNTER — Ambulatory Visit: Admitting: Family Medicine

## 2023-12-30 NOTE — Telephone Encounter (Signed)
 Attempted call to patient to see if he was able to get the medication.  Mail box full . Could not leave a voice mail message.

## 2024-01-01 ENCOUNTER — Other Ambulatory Visit: Payer: Self-pay | Admitting: Cardiology

## 2024-01-13 ENCOUNTER — Other Ambulatory Visit: Payer: Self-pay | Admitting: Family Medicine

## 2024-01-13 DIAGNOSIS — F411 Generalized anxiety disorder: Secondary | ICD-10-CM

## 2024-01-21 ENCOUNTER — Encounter: Payer: Self-pay | Admitting: Family Medicine

## 2024-01-21 ENCOUNTER — Other Ambulatory Visit: Payer: Self-pay

## 2024-01-21 DIAGNOSIS — F411 Generalized anxiety disorder: Secondary | ICD-10-CM

## 2024-01-21 MED ORDER — SERTRALINE HCL 50 MG PO TABS: 50.0000 mg | ORAL_TABLET | Freq: Every day | ORAL | 0 refills | Status: DC

## 2024-01-21 NOTE — Telephone Encounter (Signed)
 Sent both

## 2024-01-22 ENCOUNTER — Other Ambulatory Visit: Payer: Self-pay | Admitting: Family Medicine

## 2024-01-22 DIAGNOSIS — M109 Gout, unspecified: Secondary | ICD-10-CM

## 2024-02-17 ENCOUNTER — Encounter: Payer: Self-pay | Admitting: Family Medicine

## 2024-02-18 ENCOUNTER — Other Ambulatory Visit: Payer: Self-pay | Admitting: Medical Genetics

## 2024-02-18 ENCOUNTER — Ambulatory Visit: Admitting: Family Medicine

## 2024-02-18 ENCOUNTER — Encounter: Payer: Self-pay | Admitting: Family Medicine

## 2024-02-18 ENCOUNTER — Telehealth: Payer: Self-pay | Admitting: Family Medicine

## 2024-02-18 VITALS — BP 122/61 | HR 65 | Ht 68.0 in | Wt 227.0 lb

## 2024-02-18 DIAGNOSIS — F411 Generalized anxiety disorder: Secondary | ICD-10-CM | POA: Diagnosis not present

## 2024-02-18 DIAGNOSIS — E291 Testicular hypofunction: Secondary | ICD-10-CM | POA: Diagnosis not present

## 2024-02-18 DIAGNOSIS — I10 Essential (primary) hypertension: Secondary | ICD-10-CM

## 2024-02-18 DIAGNOSIS — M1A00X0 Idiopathic chronic gout, unspecified site, without tophus (tophi): Secondary | ICD-10-CM | POA: Diagnosis not present

## 2024-02-18 NOTE — Assessment & Plan Note (Signed)
  Essential hypertension On current medication regimen.  Due for updated CMP.

## 2024-02-18 NOTE — Assessment & Plan Note (Signed)
No recent flares or exacerbations.

## 2024-02-18 NOTE — Progress Notes (Signed)
 Established Patient Office Visit  Patient ID: Derek Pollard, male    DOB: 1960/05/25  Age: 63 y.o. MRN: [id number] PCP: Alvan Dorothyann BIRCH, MD  Chief Complaint  Patient presents with   Hypertension   GAD    Subjective:     HPI  Discussed the use of AI scribe software for clinical note transcription with the patient, who gave verbal consent to proceed.  History of Present Illness Derek Pollard is a 63 year old male who presents for follow-up on mood and blood pressure management.  Anxiety symptoms and antidepressant therapy - Currently taking sertraline  50 mg daily for anxiety, switched a couple of months ago - Medication provides moderate symptom control, but anxiety persists - No negative side effects from sertraline  - Interested in increasing dosage for improved symptom management  Hypertension management - Taking carvedilol  for blood pressure control - No recent chest pain or palpitations  Testosterone  replacement therapy - Using a different formulation of testosterone  due to shortage of usual prescription - Finds new formulation less satisfactory: described as 'thinner' and takes longer to dry - Experiences discomfort when putting on clothes due to delayed drying - Currently on first bottle of new formulation  Gout and musculoskeletal pain - History of gout with no recent flares - Experiencing knee pain, particularly when climbing stairs - Interested in natural remedies for inflammation, specifically turmeric  Immunization preferences - Declined pneumonia vaccine, preferring to avoid shots he does not feel are necessary     ROS    Objective:     BP 122/61   Pulse 65   Ht 5' 8 (1.727 m)   Wt 227 lb (103 kg)   SpO2 97%   BMI 34.52 kg/m    Physical Exam Vitals and nursing note reviewed.  Constitutional:      Appearance: Normal appearance.  HENT:     Head: Normocephalic and atraumatic.  Eyes:     Conjunctiva/sclera: Conjunctivae normal.   Cardiovascular:     Rate and Rhythm: Normal rate and regular rhythm.  Pulmonary:     Effort: Pulmonary effort is normal.     Breath sounds: Normal breath sounds.  Skin:    General: Skin is warm and dry.  Neurological:     Mental Status: He is alert.  Psychiatric:        Mood and Affect: Mood normal.      No results found for any visits on 02/18/24.    The ASCVD Risk score (Arnett DK, et al., 2019) failed to calculate for the following reasons:   The valid total cholesterol range is 130 to 320 mg/dL    Assessment & Plan:   Problem List Items Addressed This Visit       Cardiovascular and Mediastinum   Essential hypertension - Primary    Essential hypertension On current medication regimen.  Due for updated CMP.      Relevant Orders   Testosterone , Free, Total, SHBG   CMP14+EGFR     Endocrine   Hypogonadism in male   Testosterone  replacement therapy New formulation less satisfactory due to longer drying time. - Recheck testosterone  levels at a future date to ensure appropriate dosing.      Relevant Orders   Testosterone , Free, Total, SHBG     Other   Gout   No recent flares or exacerbations.      GAD (generalized anxiety disorder)   Generalized anxiety disorder and depression Sertraline  effective but anxiety persists. No adverse effects on current dose. -  Increase sertraline  to 100 mg daily. - Send new prescription to [facility]. - Advise to take two 50 mg tablets simultaneously until new prescription is filled. - Instruct to monitor for side effects and report if further adjustment is needed. - Reassess in 2-4 weeks to evaluate effectiveness of increased dose.      Relevant Orders   Testosterone , Free, Total, SHBG   CMP14+EGFR    Assessment and Plan Assessment & Plan  Knee pain likely due to osteoarthritis Pain suggests patellofemoral compartment involvement, likely osteoarthritis. - Provide exercises to reduce frequency and intensity of  knee pain. - Recommend turmeric supplements for inflammation management.      Return in about 6 months (around 08/18/2024) for Hypertension.    Dorothyann Byars, MD [facility] at [facility]

## 2024-02-18 NOTE — Assessment & Plan Note (Signed)
 Generalized anxiety disorder and depression Sertraline  effective but anxiety persists. No adverse effects on current dose. - Increase sertraline  to 100 mg daily. - Send new prescription to [facility]. - Advise to take two 50 mg tablets simultaneously until new prescription is filled. - Instruct to monitor for side effects and report if further adjustment is needed. - Reassess in 2-4 weeks to evaluate effectiveness of increased dose.

## 2024-02-18 NOTE — Assessment & Plan Note (Signed)
 Testosterone  replacement therapy New formulation less satisfactory due to longer drying time. - Recheck testosterone  levels at a future date to ensure appropriate dosing.

## 2024-02-18 NOTE — Telephone Encounter (Signed)
 Call patient and let him know that we will mail him a copy of the exercises for his knees.  Please mail patient handout for patellofemoral syndrome.

## 2024-02-19 LAB — CMP14+EGFR
ALT: 44 IU/L (ref 0–44)
AST: 42 IU/L — ABNORMAL HIGH (ref 0–40)
Albumin: 3.9 g/dL (ref 3.9–4.9)
Alkaline Phosphatase: 66 IU/L (ref 47–123)
BUN/Creatinine Ratio: 22 (ref 10–24)
BUN: 19 mg/dL (ref 8–27)
Bilirubin Total: 0.8 mg/dL (ref 0.0–1.2)
CO2: 23 mmol/L (ref 20–29)
Calcium: 9.1 mg/dL (ref 8.6–10.2)
Chloride: 104 mmol/L (ref 96–106)
Creatinine, Ser: 0.88 mg/dL (ref 0.76–1.27)
Globulin, Total: 2.2 g/dL (ref 1.5–4.5)
Glucose: 107 mg/dL — ABNORMAL HIGH (ref 70–99)
Potassium: 4.6 mmol/L (ref 3.5–5.2)
Sodium: 140 mmol/L (ref 134–144)
Total Protein: 6.1 g/dL (ref 6.0–8.5)
eGFR: 98 mL/min/1.73 (ref >59)

## 2024-02-19 LAB — TESTOSTERONE, FREE, TOTAL, SHBG
Sex Hormone Binding: 31.1 nmol/L (ref 19.3–76.4)
Testosterone, Free: 9.3 pg/mL (ref 6.6–18.1)
Testosterone: 338 ng/dL (ref 264–916)

## 2024-02-19 NOTE — Telephone Encounter (Signed)
 Spoke with the pt who would like the exercises upload to his MyChart.

## 2024-02-20 ENCOUNTER — Encounter: Payer: Self-pay | Admitting: Family Medicine

## 2024-02-20 ENCOUNTER — Ambulatory Visit: Payer: Self-pay | Admitting: Family Medicine

## 2024-02-20 DIAGNOSIS — R7401 Elevation of levels of liver transaminase levels: Secondary | ICD-10-CM

## 2024-02-20 NOTE — Progress Notes (Signed)
 Hi Derek Pollard, your liver enzyme called the AST is slightly elevated it has been normal previously.  I want to keep an eye on that and plan to recheck again in 4 to 6 weeks.  Avoid any excess Tylenol or alcohol products.  Eating more healthy high vegetable fiber diet can help as well.  Testosterone  level looks okay.

## 2024-02-24 ENCOUNTER — Ambulatory Visit: Admitting: Family Medicine

## 2024-03-26 ENCOUNTER — Other Ambulatory Visit: Payer: Self-pay | Admitting: Family Medicine

## 2024-03-26 DIAGNOSIS — F411 Generalized anxiety disorder: Secondary | ICD-10-CM

## 2024-03-27 ENCOUNTER — Encounter: Payer: Self-pay | Admitting: Family Medicine

## 2024-03-27 DIAGNOSIS — F411 Generalized anxiety disorder: Secondary | ICD-10-CM

## 2024-03-27 MED ORDER — SERTRALINE HCL 50 MG PO TABS: 100.0000 mg | ORAL_TABLET | Freq: Every day | ORAL | 1 refills | Status: DC

## 2024-03-27 MED ORDER — SERTRALINE HCL 100 MG PO TABS: 100.0000 mg | ORAL_TABLET | Freq: Every day | ORAL | 1 refills | Status: AC

## 2024-04-01 ENCOUNTER — Other Ambulatory Visit: Payer: Self-pay | Admitting: Cardiology

## 2024-04-01 DIAGNOSIS — I10 Essential (primary) hypertension: Secondary | ICD-10-CM

## 2024-04-17 ENCOUNTER — Encounter: Payer: Self-pay | Admitting: Family Medicine

## 2024-04-17 DIAGNOSIS — K529 Noninfective gastroenteritis and colitis, unspecified: Secondary | ICD-10-CM

## 2024-04-20 NOTE — Telephone Encounter (Signed)
 There is a lab order on file for him for the liver enzymes it was ordered October 31.  As far as the E. coli is this in the urine?  We can always get a urine sample and add a culture if needed

## 2024-04-21 LAB — CMP14+EGFR
ALT: 46 IU/L — ABNORMAL HIGH (ref 0–44)
AST: 39 IU/L (ref 0–40)
Albumin: 4 g/dL (ref 3.9–4.9)
Alkaline Phosphatase: 71 IU/L (ref 47–123)
BUN/Creatinine Ratio: 16 (ref 10–24)
BUN: 18 mg/dL (ref 8–27)
Bilirubin Total: 1.4 mg/dL — ABNORMAL HIGH (ref 0.0–1.2)
CO2: 24 mmol/L (ref 20–29)
Calcium: 8.8 mg/dL (ref 8.6–10.2)
Chloride: 102 mmol/L (ref 96–106)
Creatinine, Ser: 1.13 mg/dL (ref 0.76–1.27)
Globulin, Total: 2.2 g/dL (ref 1.5–4.5)
Glucose: 121 mg/dL — ABNORMAL HIGH (ref 70–99)
Potassium: 4.7 mmol/L (ref 3.5–5.2)
Sodium: 139 mmol/L (ref 134–144)
Total Protein: 6.2 g/dL (ref 6.0–8.5)
eGFR: 74 mL/min/1.73

## 2024-04-21 NOTE — Progress Notes (Signed)
 Hi Derek Pollard, interestingly your AST liver enzyme came back down into the normal range compared to 2 months ago but now your ALT went up slightly.  Nothing in a worrisome range but again just a slight shift there I do not continue to keep an eye on it and plan to recheck again in 3 months.  Continue to work on healthy Mediterranean diet and exercising for 30 minutes 5 days a week this helps improve inflammation in the liver.

## 2024-04-22 NOTE — Telephone Encounter (Signed)
 Stool pathogen test ordered  Orders Placed This Encounter  Procedures   GI pathogen panel by PCR, stool

## 2024-04-27 ENCOUNTER — Other Ambulatory Visit: Payer: Self-pay

## 2024-04-27 DIAGNOSIS — K529 Noninfective gastroenteritis and colitis, unspecified: Secondary | ICD-10-CM

## 2024-04-29 ENCOUNTER — Other Ambulatory Visit: Payer: Self-pay | Admitting: Family Medicine

## 2024-04-29 DIAGNOSIS — E291 Testicular hypofunction: Secondary | ICD-10-CM

## 2024-05-01 ENCOUNTER — Ambulatory Visit: Payer: Self-pay | Admitting: Family Medicine

## 2024-05-01 LAB — GI PROFILE, STOOL, PCR

## 2024-05-01 LAB — SPECIMEN STATUS REPORT

## 2024-05-01 NOTE — Progress Notes (Signed)
 Marquist, the stool sample was negative for bacteria's and viruses on the panel.  It is pretty broad-spectrum but does not rule out everything.  If you are still having the loose stools the next up would be to refer you to GI for further workup.

## 2024-08-18 ENCOUNTER — Ambulatory Visit: Admitting: Family Medicine
